# Patient Record
Sex: Female | Born: 1975
Health system: Southern US, Community
[De-identification: ages and names within clinical notes are randomized; demographics above are authoritative.]

## PROBLEM LIST (undated history)

## (undated) DIAGNOSIS — F419 Anxiety disorder, unspecified: Secondary | ICD-10-CM

## (undated) DIAGNOSIS — G43909 Migraine, unspecified, not intractable, without status migrainosus: Secondary | ICD-10-CM

## (undated) DIAGNOSIS — L409 Psoriasis, unspecified: Secondary | ICD-10-CM

## (undated) DIAGNOSIS — M8580 Other specified disorders of bone density and structure, unspecified site: Secondary | ICD-10-CM

## (undated) HISTORY — DX: Migraine, unspecified, not intractable, without status migrainosus: G43.909

## (undated) HISTORY — DX: Other specified disorders of bone density and structure, unspecified site: M85.80

## (undated) HISTORY — DX: Psoriasis, unspecified: L40.9

## (undated) HISTORY — DX: Anxiety disorder, unspecified: F41.9

---

## 2007-01-22 ENCOUNTER — Ambulatory Visit: Payer: Self-pay | Admitting: Orthopaedic Surgery

## 2009-04-05 ENCOUNTER — Ambulatory Visit: Payer: Self-pay

## 2009-04-08 ENCOUNTER — Inpatient Hospital Stay: Payer: Self-pay

## 2011-09-08 ENCOUNTER — Ambulatory Visit: Payer: Self-pay

## 2012-07-29 ENCOUNTER — Ambulatory Visit: Payer: Self-pay | Admitting: Sports Medicine

## 2012-09-27 ENCOUNTER — Ambulatory Visit (INDEPENDENT_AMBULATORY_CARE_PROVIDER_SITE_OTHER): Payer: 59 | Admitting: Sports Medicine

## 2012-09-27 ENCOUNTER — Encounter: Payer: Self-pay | Admitting: Sports Medicine

## 2012-09-27 VITALS — BP 116/70 | Ht 62.0 in | Wt 138.0 lb

## 2012-09-27 DIAGNOSIS — M79609 Pain in unspecified limb: Secondary | ICD-10-CM

## 2012-09-27 DIAGNOSIS — M79604 Pain in right leg: Secondary | ICD-10-CM

## 2012-09-27 DIAGNOSIS — M84369A Stress fracture, unspecified tibia and fibula, initial encounter for fracture: Secondary | ICD-10-CM

## 2012-09-27 HISTORY — DX: Stress fracture, unspecified tibia and fibula, initial encounter for fracture: M84.369A

## 2012-09-27 HISTORY — DX: Pain in right leg: M79.604

## 2012-09-27 NOTE — Assessment & Plan Note (Signed)
Patient was fitted for a : standard, cushioned, semi-rigid orthotic. The orthotic was heated and afterward the patient stood on the orthotic blank positioned on the orthotic stand. The patient was positioned in subtalar neutral position and 10 degrees of ankle dorsiflexion in a weight bearing stance. After completion of molding, a stable base was applied to the orthotic blank. The blank was ground to a stable position for weight bearing. Size: 8 red EVA Base: blue EVA Posting: none Additional orthotic padding: none  Time 35 mins  Running gait comfortable in orthotics/  Hop test negative  OK to restart running at 20 min workouts qod and increase 5 mins per week  Reck with Dr Zachery Dauer if not imporved in 6 wks
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
## 2012-09-27 NOTE — Assessment & Plan Note (Signed)
She needs hip abduction exercises and hip core exercise series  Given today

## 2015-07-07 ENCOUNTER — Emergency Department
Admission: EM | Admit: 2015-07-07 | Discharge: 2015-07-07 | Disposition: A | Discharge status: Home / Self Care | Payer: 59 | Attending: Emergency Medicine | Admitting: Emergency Medicine

## 2015-07-07 ENCOUNTER — Emergency Department: Payer: 59

## 2015-07-07 ENCOUNTER — Encounter: Payer: Self-pay | Admitting: Emergency Medicine

## 2015-07-07 DIAGNOSIS — Y92410 Unspecified street and highway as the place of occurrence of the external cause: Secondary | ICD-10-CM | POA: Insufficient documentation

## 2015-07-07 DIAGNOSIS — S7012XA Contusion of left thigh, initial encounter: Secondary | ICD-10-CM | POA: Diagnosis not present

## 2015-07-07 DIAGNOSIS — S20212A Contusion of left front wall of thorax, initial encounter: Secondary | ICD-10-CM | POA: Insufficient documentation

## 2015-07-07 DIAGNOSIS — S7002XA Contusion of left hip, initial encounter: Secondary | ICD-10-CM | POA: Insufficient documentation

## 2015-07-07 DIAGNOSIS — Y998 Other external cause status: Secondary | ICD-10-CM | POA: Insufficient documentation

## 2015-07-07 DIAGNOSIS — Y9389 Activity, other specified: Secondary | ICD-10-CM | POA: Diagnosis not present

## 2015-07-07 DIAGNOSIS — S299XXA Unspecified injury of thorax, initial encounter: Secondary | ICD-10-CM | POA: Diagnosis present

## 2015-07-07 MED ORDER — PREDNISONE 10 MG PO TABS: 10.0000 mg | ORAL_TABLET | ORAL | Status: DC

## 2015-07-07 MED ORDER — OXYCODONE-ACETAMINOPHEN 5-325 MG PO TABS: 1.0000 | ORAL_TABLET | Freq: Four times a day (QID) | ORAL | Status: DC | PRN
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
## 2015-07-07 NOTE — Discharge Instructions (Signed)
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
## 2015-09-03 ENCOUNTER — Encounter: Payer: Self-pay | Admitting: Dietician

## 2015-09-03 ENCOUNTER — Encounter: Payer: 59 | Attending: Obstetrics and Gynecology | Admitting: Dietician

## 2015-09-03 VITALS — Ht 62.0 in | Wt 144.6 lb

## 2015-09-03 DIAGNOSIS — R635 Abnormal weight gain: Secondary | ICD-10-CM | POA: Diagnosis not present
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
## 2015-09-03 NOTE — Patient Instructions (Signed)
   Gradually increase calorie intake by adding about 100calories a day for 3-5 weeks, then adding another 100 calories for the same period of time until you reach At least 1200; actual needs for training are likely higher.   Andrea Goodman will investigate additional resources to help with caloric intake and weight management while training.

## 2016-08-20 ENCOUNTER — Other Ambulatory Visit: Payer: Self-pay | Admitting: Obstetrics and Gynecology

## 2016-08-20 DIAGNOSIS — Z1231 Encounter for screening mammogram for malignant neoplasm of breast: Secondary | ICD-10-CM

## 2016-10-01 ENCOUNTER — Encounter: Payer: Self-pay | Admitting: Radiology

## 2016-10-01 ENCOUNTER — Ambulatory Visit
Admission: RE | Admit: 2016-10-01 | Discharge: 2016-10-01 | Disposition: A | Discharge status: Home / Self Care | Payer: 59 | Source: Ambulatory Visit | Attending: Obstetrics and Gynecology | Admitting: Obstetrics and Gynecology

## 2016-10-01 DIAGNOSIS — Z1231 Encounter for screening mammogram for malignant neoplasm of breast: Secondary | ICD-10-CM | POA: Diagnosis not present

## 2016-11-21 ENCOUNTER — Ambulatory Visit: Payer: No Typology Code available for payment source | Admitting: Physical Therapy

## 2016-11-25 ENCOUNTER — Encounter: Payer: 59 | Admitting: Physical Therapy

## 2016-12-01 ENCOUNTER — Encounter: Payer: 59 | Admitting: Physical Therapy

## 2016-12-08 ENCOUNTER — Encounter: Payer: 59 | Admitting: Physical Therapy

## 2016-12-15 ENCOUNTER — Encounter: Payer: 59 | Admitting: Physical Therapy

## 2016-12-22 ENCOUNTER — Encounter: Payer: 59 | Admitting: Physical Therapy

## 2017-01-05 ENCOUNTER — Encounter: Payer: 59 | Admitting: Physical Therapy

## 2017-01-19 ENCOUNTER — Encounter: Payer: 59 | Admitting: Physical Therapy

## 2017-02-19 DIAGNOSIS — M542 Cervicalgia: Secondary | ICD-10-CM | POA: Diagnosis not present

## 2017-09-15 DIAGNOSIS — H1032 Unspecified acute conjunctivitis, left eye: Secondary | ICD-10-CM | POA: Diagnosis not present

## 2017-12-17 ENCOUNTER — Other Ambulatory Visit: Payer: Self-pay | Admitting: Obstetrics and Gynecology

## 2017-12-17 DIAGNOSIS — Z1231 Encounter for screening mammogram for malignant neoplasm of breast: Secondary | ICD-10-CM

## 2017-12-17 DIAGNOSIS — Z01419 Encounter for gynecological examination (general) (routine) without abnormal findings: Secondary | ICD-10-CM | POA: Diagnosis not present

## 2018-01-04 ENCOUNTER — Ambulatory Visit
Admission: RE | Admit: 2018-01-04 | Discharge: 2018-01-04 | Disposition: A | Discharge status: Home / Self Care | Payer: 59 | Source: Ambulatory Visit | Attending: Obstetrics and Gynecology | Admitting: Obstetrics and Gynecology

## 2018-01-04 DIAGNOSIS — Z1231 Encounter for screening mammogram for malignant neoplasm of breast: Secondary | ICD-10-CM

## 2018-01-11 ENCOUNTER — Other Ambulatory Visit: Payer: Self-pay | Admitting: Obstetrics and Gynecology

## 2018-01-11 DIAGNOSIS — N6489 Other specified disorders of breast: Secondary | ICD-10-CM

## 2018-01-11 DIAGNOSIS — R928 Other abnormal and inconclusive findings on diagnostic imaging of breast: Secondary | ICD-10-CM

## 2018-01-12 ENCOUNTER — Ambulatory Visit
Admission: RE | Admit: 2018-01-12 | Discharge: 2018-01-12 | Disposition: A | Discharge status: Home / Self Care | Payer: 59 | Source: Ambulatory Visit | Attending: Obstetrics and Gynecology | Admitting: Obstetrics and Gynecology

## 2018-01-12 DIAGNOSIS — N6489 Other specified disorders of breast: Secondary | ICD-10-CM

## 2018-01-12 DIAGNOSIS — R928 Other abnormal and inconclusive findings on diagnostic imaging of breast: Secondary | ICD-10-CM | POA: Diagnosis not present

## 2018-09-27 ENCOUNTER — Other Ambulatory Visit: Payer: Self-pay | Admitting: Obstetrics and Gynecology

## 2018-09-27 MED ORDER — OSELTAMIVIR PHOSPHATE 75 MG PO CAPS: 75.0000 mg | ORAL_CAPSULE | Freq: Two times a day (BID) | ORAL | 0 refills | Status: AC

## 2018-09-27 NOTE — Progress Notes (Signed)
Rx tamiflu for pos flu sx. Dad with pos flu and test.

## 2019-02-08 DIAGNOSIS — Z1231 Encounter for screening mammogram for malignant neoplasm of breast: Secondary | ICD-10-CM | POA: Diagnosis not present

## 2019-02-08 DIAGNOSIS — Z01419 Encounter for gynecological examination (general) (routine) without abnormal findings: Secondary | ICD-10-CM | POA: Diagnosis not present

## 2019-02-09 ENCOUNTER — Other Ambulatory Visit: Payer: Self-pay | Admitting: Obstetrics and Gynecology

## 2019-02-09 DIAGNOSIS — Z1231 Encounter for screening mammogram for malignant neoplasm of breast: Secondary | ICD-10-CM

## 2019-06-26 IMAGING — MG MM DIGITAL SCREENING BILAT W/ TOMO W/ CAD
8 of 14 series · 8 of 30 positions shown · non-contrast
Comparison: Previous exam(s).

CLINICAL DATA: Screening.

EXAM:
DIGITAL SCREENING BILATERAL MAMMOGRAM WITH TOMO AND CAD

[R XCCL]
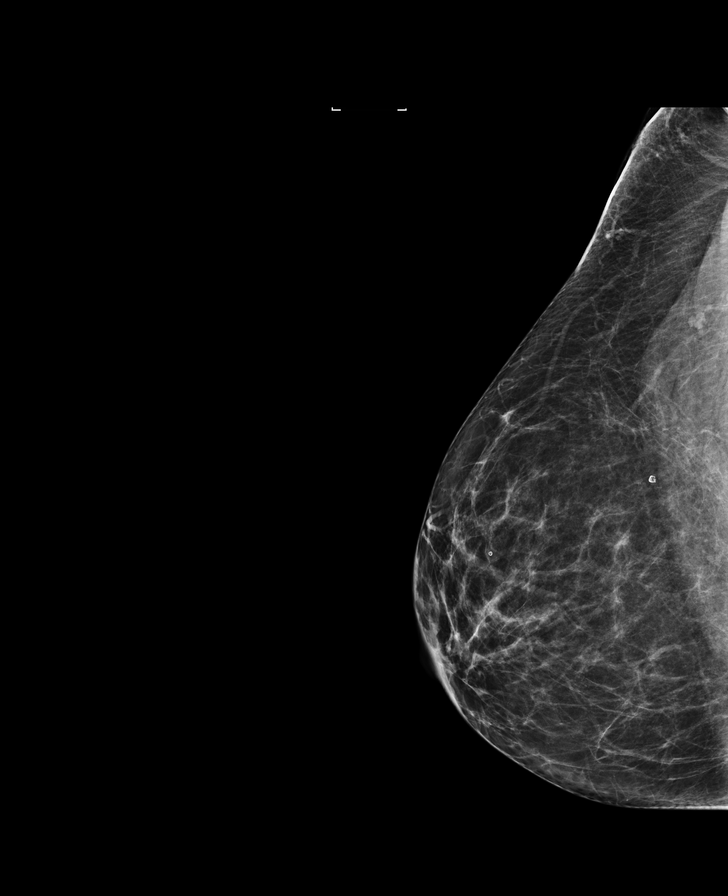

[L MLO (1 of 2)]
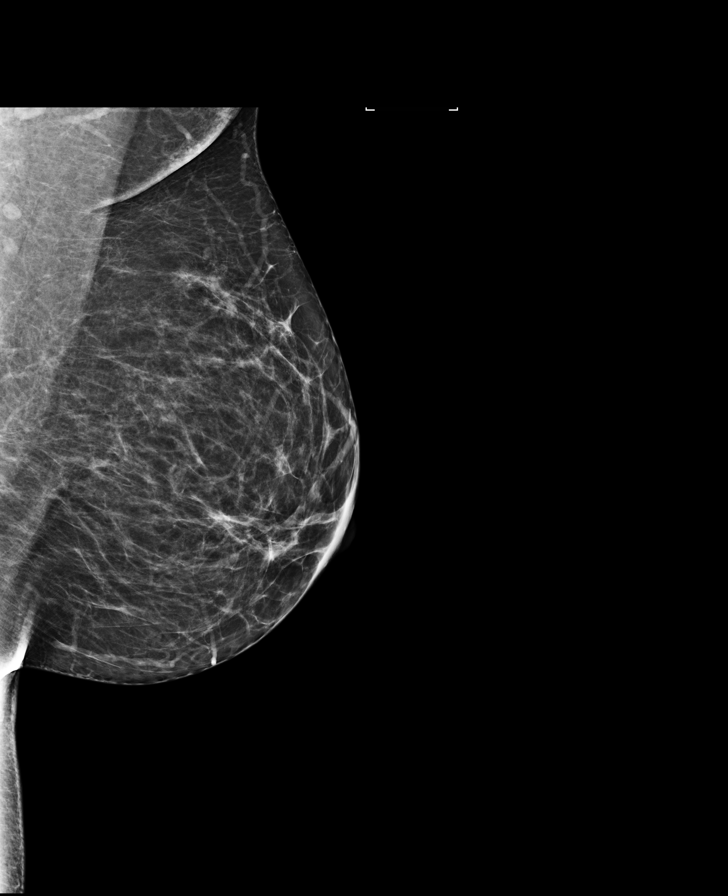

[L CC synth-2D]
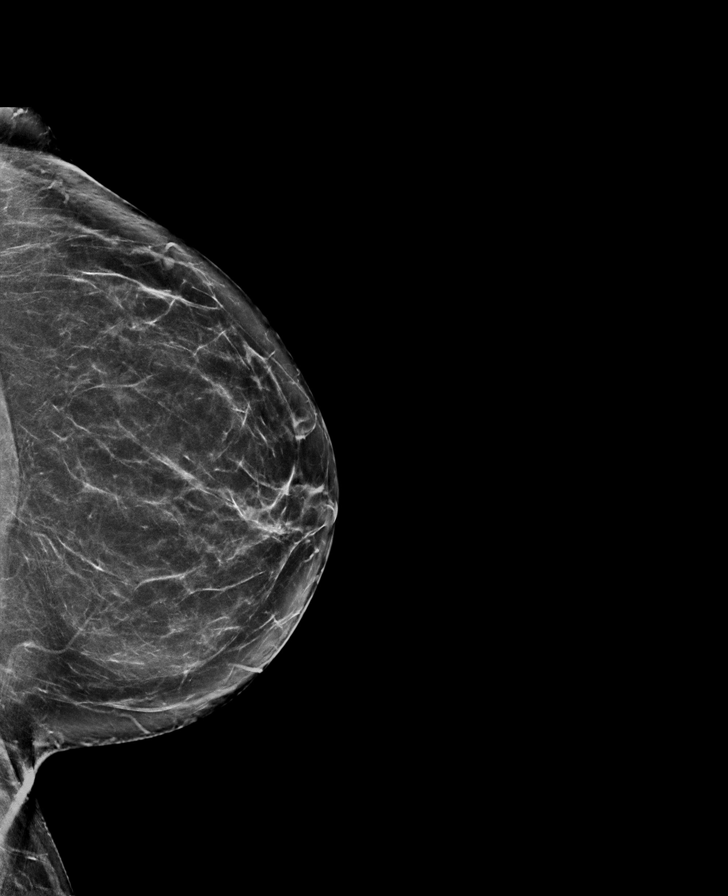

[L MLO synth-2D]
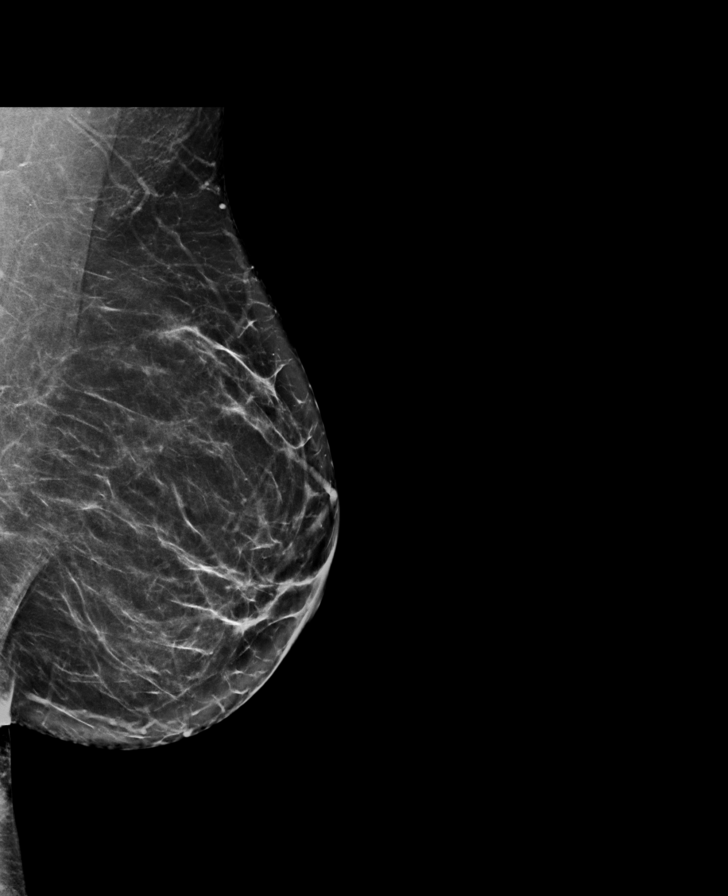

[L MLO (2 of 2)]
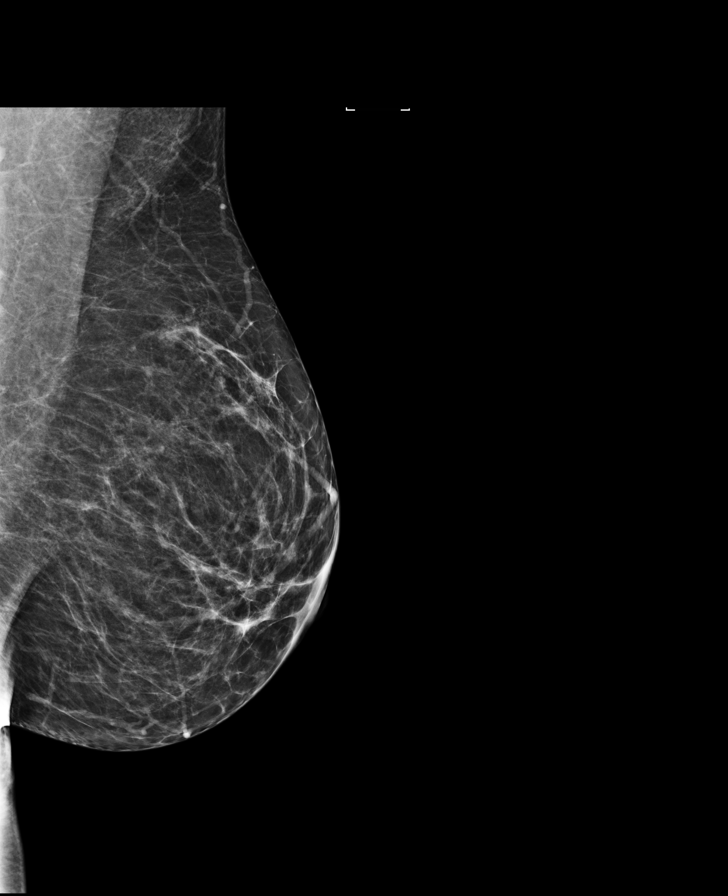

[R CC]
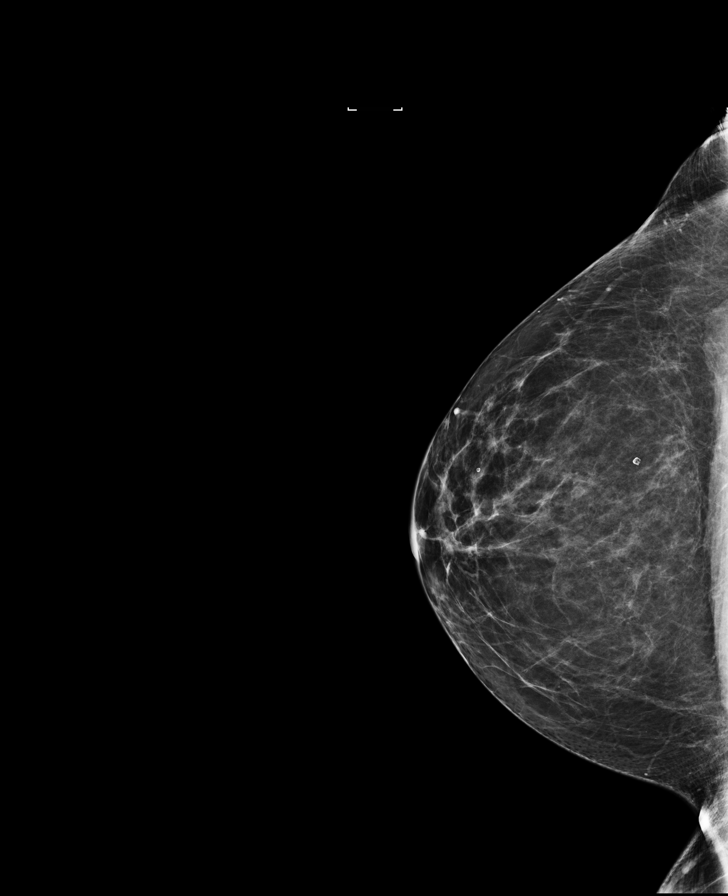

[R MLO synth-2D]
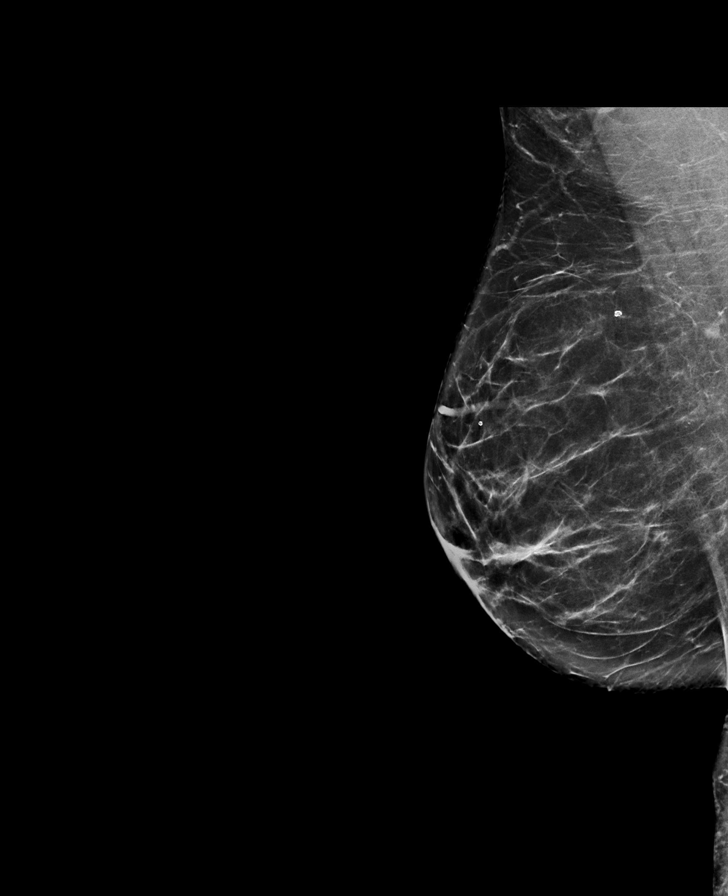

[L CC]
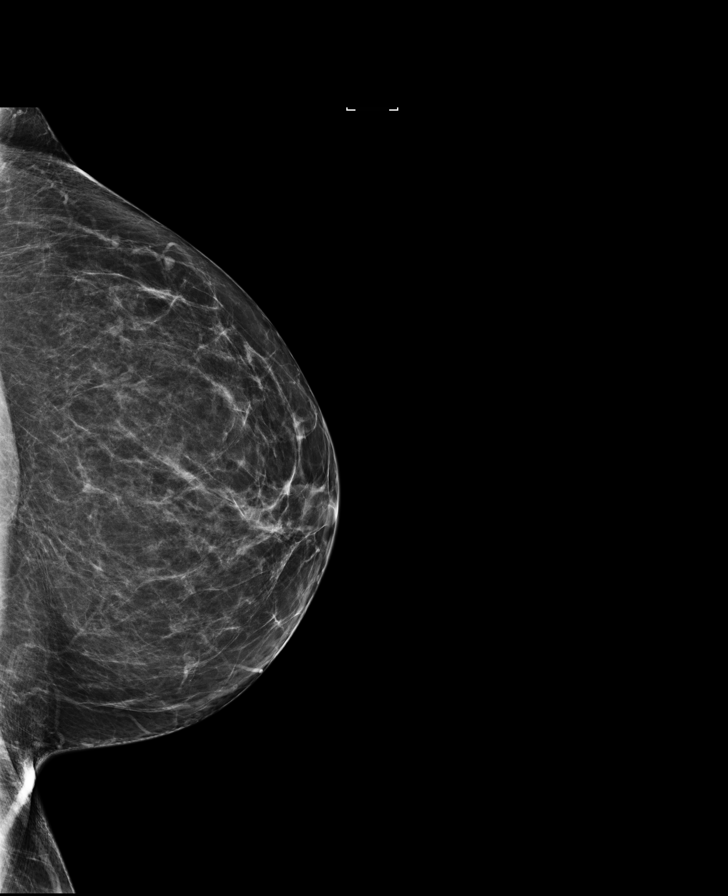

[8 of 30 positions shown; findings below may reference images not displayed]

ACR Breast Density Category b: There are scattered areas of
fibroglandular density.
FINDINGS: In the right breast, a possible asymmetry warrants further
evaluation. In the left breast, no findings suspicious for
malignancy. Images were processed with CAD.
IMPRESSION: Further evaluation is suggested for possible asymmetry in the right
breast.

RECOMMENDATION:
Diagnostic mammogram and possibly ultrasound of the right breast.
(Code:[id number]-[id number])

The patient will be contacted regarding the findings, and additional
imaging will be scheduled.

BI-RADS CATEGORY  0: Incomplete. Need additional imaging evaluation
and/or prior mammograms for comparison.

## 2019-07-26 ENCOUNTER — Other Ambulatory Visit: Payer: Self-pay | Admitting: Neurology

## 2019-07-26 DIAGNOSIS — G43119 Migraine with aura, intractable, without status migrainosus: Secondary | ICD-10-CM

## 2019-12-21 ENCOUNTER — Other Ambulatory Visit: Payer: Self-pay

## 2019-12-21 MED ORDER — SKYRIZI (150 MG DOSE) 75 MG/0.83ML ~~LOC~~ PSKT: 150.0000 mg | PREFILLED_SYRINGE | SUBCUTANEOUS | 6 refills | Status: DC

## 2019-12-21 MED ORDER — SKYRIZI (150 MG DOSE) 75 MG/0.83ML ~~LOC~~ PSKT: 150.0000 mg | PREFILLED_SYRINGE | SUBCUTANEOUS | 1 refills | Status: DC

## 2019-12-21 NOTE — Progress Notes (Signed)
Phone note for labs in Andrea Goodman.

## 2020-01-24 ENCOUNTER — Ambulatory Visit: Payer: 59 | Admitting: Dermatology

## 2020-01-24 ENCOUNTER — Other Ambulatory Visit: Payer: Self-pay

## 2020-01-24 DIAGNOSIS — L988 Other specified disorders of the skin and subcutaneous tissue: Secondary | ICD-10-CM

## 2020-01-24 DIAGNOSIS — L409 Psoriasis, unspecified: Secondary | ICD-10-CM

## 2020-01-24 NOTE — Progress Notes (Addendum)
   Follow-Up Visit   Subjective  Andrea Goodman is a 44 y.o. female who presents for the following: Psoriasis. Patient is currently using Luxiq foam, Dermotic Oil, and Elidel Cream for psoriasis in the scalp, ears, chest, and skin folds. She was approved for [employer] and it will be delivered to her house today. She has not had injection training, but she has a friend that is a PA that she will ask to give her injections. Patient also aware that she can come back to our office to receive injections and/or training.  The following portions of the chart were reviewed this encounter and updated as appropriate:     Review of Systems: No other skin or systemic complaints.  Objective  Well appearing patient in no apparent distress; mood and affect are within normal limits.  A focused examination was performed including face, scalp, ears, chest. Relevant physical exam findings are noted in the Assessment and Plan.  Objective  Forehead, frown complex: Rhytides with facial movement   Objective  Scalp, ears, chest, skin folds: Erythema on frontal scalp, ears, with mild scaling.  Assessment & Plan  Elastosis of skin Forehead, frown complex  Improved with Botox injections. Patient pleased, but would like to have a little more added to forehead at next visit.  Psoriasis Scalp, ears, chest, skin folds  Patient has been approved for [employer] and will be delivered to her house today. She will have friend (PA) do injections. Pt advised she can come to office for injections/training if needed. She will start loading dose Skyrizi 150mg /mL SQ on week 0 and 4, then SQ q 12 weeks for maintenance.  Continue Luxiq foam qd/bid prn flares. Continue Dermotic Oil qd/bid prn flares. Continue Elidel Cream qd/bid prn flares.  Return in about 6 months (around 07/25/2020) for psoriasis.   IJamesetta Orleans, CMA, am acting as scribe for Brendolyn Patty, MD .  Documentation: I have reviewed the above  documentation for accuracy and completeness, and I agree with the above.  Brendolyn Patty MD

## 2020-04-02 ENCOUNTER — Other Ambulatory Visit: Payer: Self-pay

## 2020-04-02 ENCOUNTER — Ambulatory Visit (INDEPENDENT_AMBULATORY_CARE_PROVIDER_SITE_OTHER): Payer: Self-pay | Admitting: Dermatology

## 2020-04-02 DIAGNOSIS — L988 Other specified disorders of the skin and subcutaneous tissue: Secondary | ICD-10-CM

## 2020-04-02 NOTE — Progress Notes (Signed)
   Follow-Up Visit   Subjective  Andrea Goodman is a 44 y.o. female who presents for the following: Botox injections (Patient would like a little more injected to forehead today. ).   The following portions of the chart were reviewed this encounter and updated as appropriate:      Review of Systems:  No other skin or systemic complaints except as noted in HPI or Assessment and Plan.  Objective  Well appearing patient in no apparent distress; mood and affect are within normal limits.  A focused examination was performed including face. Relevant physical exam findings are noted in the Assessment and Plan.  Objective  Forehead, Glabella: Facial elastosis forehead, glabella  Images           Assessment & Plan  Elastosis of skin Forehead, Glabella  Botox Injection - Forehead, Glabella Location: See attached image  Informed consent: Discussed risks (infection, pain, bleeding, bruising, swelling, allergic reaction, paralysis of nearby muscles, eyelid droop, double vision, neck weakness, difficulty breathing, headache, undesirable cosmetic result, and need for additional treatment) and benefits of the procedure, as well as the alternatives.  Informed consent was obtained.  Preparation: The area was cleansed with alcohol.  Procedure Details:  Botox was injected into the dermis with a 30-gauge needle. Pressure applied to any bleeding. Ice packs offered for swelling.  Lot Number:  [id number] Expiration:  06/2022  Total Units Injected:  30  Plan: Patient was instructed to remain upright for 4 hours. Patient was instructed to avoid massaging the face and avoid vigorous exercise for the rest of the day. Tylenol may be used for headache.  Allow 2 weeks before returning to clinic for additional dosing as needed. Patient will call for any problems.   Return in about 3 months (around 07/03/2020) for Botox.   IJamesetta Orleans, CMA, am acting as scribe for Brendolyn Patty, MD  .  Documentation: I have reviewed the above documentation for accuracy and completeness, and I agree with the above.  Brendolyn Patty MD

## 2020-04-26 ENCOUNTER — Other Ambulatory Visit: Payer: Self-pay | Admitting: Obstetrics and Gynecology

## 2020-04-26 DIAGNOSIS — Z1231 Encounter for screening mammogram for malignant neoplasm of breast: Secondary | ICD-10-CM

## 2020-05-15 ENCOUNTER — Other Ambulatory Visit: Payer: Self-pay

## 2020-05-15 ENCOUNTER — Ambulatory Visit
Admission: RE | Admit: 2020-05-15 | Discharge: 2020-05-15 | Disposition: A | Discharge status: Home / Self Care | Payer: 59 | Source: Ambulatory Visit | Attending: Obstetrics and Gynecology | Admitting: Obstetrics and Gynecology

## 2020-05-15 DIAGNOSIS — Z1231 Encounter for screening mammogram for malignant neoplasm of breast: Secondary | ICD-10-CM | POA: Diagnosis not present

## 2020-06-18 ENCOUNTER — Other Ambulatory Visit: Payer: Self-pay

## 2020-06-18 ENCOUNTER — Ambulatory Visit (INDEPENDENT_AMBULATORY_CARE_PROVIDER_SITE_OTHER): Payer: 59 | Admitting: Dermatology

## 2020-06-18 DIAGNOSIS — L988 Other specified disorders of the skin and subcutaneous tissue: Secondary | ICD-10-CM

## 2020-06-18 NOTE — Progress Notes (Signed)
   Follow-Up Visit   Subjective  Andrea Goodman is a 44 y.o. female who presents for the following: Facial Elastosis (Botox injections). glabella and forehead   The following portions of the chart were reviewed this encounter and updated as appropriate:      Review of Systems:  No other skin or systemic complaints except as noted in HPI or Assessment and Plan.  Objective  Well appearing patient in no apparent distress; mood and affect are within normal limits.  A focused examination was performed including face. Relevant physical exam findings are noted in the Assessment and Plan.  Objective  Frown complex, forehead: Rhytides and volume loss.   Images     Assessment & Plan  Elastosis of skin Frown complex, forehead  Photo diagram from 04/02/20 was documented incorrectly. Frown complex should have been 20 units total (was marked 15u). Patient was charged and injected correctly at that visit.  Botox Injection - Frown complex, forehead Location: See attached image  Informed consent: Discussed risks (infection, pain, bleeding, bruising, swelling, allergic reaction, paralysis of nearby muscles, eyelid droop, double vision, neck weakness, difficulty breathing, headache, undesirable cosmetic result, and need for additional treatment) and benefits of the procedure, as well as the alternatives.  Informed consent was obtained.  Preparation: The area was cleansed with alcohol.  Procedure Details:  Botox was injected into the dermis with a 30-gauge needle. Pressure applied to any bleeding. Ice packs offered for swelling.  Lot Number:  [id number] Expiration:  08/2022  Total Units Injected:  30  Plan: Patient was instructed to remain upright for 4 hours. Patient was instructed to avoid massaging the face and avoid vigorous exercise for the rest of the day. Tylenol may be used for headache.  Allow 2 weeks before returning to clinic for additional dosing as needed. Patient will call  for any problems.   Return in about 3 months (around 09/17/2020) for Botox injections.  IJamesetta Orleans, CMA, am acting as scribe for Brendolyn Patty, MD .  Documentation: I have reviewed the above documentation for accuracy and completeness, and I agree with the above.  Brendolyn Patty MD

## 2020-06-19 ENCOUNTER — Encounter: Payer: 59 | Admitting: Dermatology

## 2020-06-25 ENCOUNTER — Ambulatory Visit: Payer: 59 | Admitting: Dermatology

## 2020-07-09 ENCOUNTER — Ambulatory Visit (INDEPENDENT_AMBULATORY_CARE_PROVIDER_SITE_OTHER): Payer: 59 | Admitting: Dermatology

## 2020-07-09 ENCOUNTER — Other Ambulatory Visit: Payer: Self-pay

## 2020-07-09 DIAGNOSIS — L409 Psoriasis, unspecified: Secondary | ICD-10-CM | POA: Diagnosis not present

## 2020-07-09 DIAGNOSIS — L578 Other skin changes due to chronic exposure to nonionizing radiation: Secondary | ICD-10-CM

## 2020-07-09 DIAGNOSIS — D1800 Hemangioma unspecified site: Secondary | ICD-10-CM

## 2020-07-09 DIAGNOSIS — L821 Other seborrheic keratosis: Secondary | ICD-10-CM

## 2020-07-09 DIAGNOSIS — L820 Inflamed seborrheic keratosis: Secondary | ICD-10-CM | POA: Diagnosis not present

## 2020-07-09 DIAGNOSIS — Z79899 Other long term (current) drug therapy: Secondary | ICD-10-CM

## 2020-07-09 DIAGNOSIS — L650 Telogen effluvium: Secondary | ICD-10-CM

## 2020-07-09 DIAGNOSIS — L988 Other specified disorders of the skin and subcutaneous tissue: Secondary | ICD-10-CM

## 2020-07-09 DIAGNOSIS — D229 Melanocytic nevi, unspecified: Secondary | ICD-10-CM

## 2020-07-09 DIAGNOSIS — Z1283 Encounter for screening for malignant neoplasm of skin: Secondary | ICD-10-CM | POA: Diagnosis not present

## 2020-07-09 DIAGNOSIS — L814 Other melanin hyperpigmentation: Secondary | ICD-10-CM

## 2020-07-09 NOTE — Progress Notes (Signed)
Follow-Up Visit   Subjective  Andrea Goodman is a 44 y.o. female who presents for the following: Annual Exam (Total body skin exam), Psoriasis (scalp, ears, chest, skyrizi injections q 12 wks, not having to use any topicals), and growths (forehead, legs, abd, pt picks at). Psoriasis is much improved, and no side effects from injections, no infections.  Pt recently had botox 3 weeks ago, glabella and forehead.  She has also noticed increased hair loss over past several months.  No surgeries or illnesses prior to onset. She has been anemic in the past.  She has enough protein in diet.   The following portions of the chart were reviewed this encounter and updated as appropriate:      Review of Systems:  No other skin or systemic complaints except as noted in HPI or Assessment and Plan.  Objective  Well appearing patient in no apparent distress; mood and affect are within normal limits.  A full examination was performed including scalp, head, eyes, ears, nose, lips, neck, chest, axillae, abdomen, back, buttocks, bilateral upper extremities, bilateral lower extremities, hands, feet, fingers, toes, fingernails, and toenails. All findings within normal limits unless otherwise noted below.  Objective  R hip x 1, R inframammary x 1, upper forehead x 9, L abdomen x 1 (12): Erythematous keratotic or waxy stuck-on papule.   Head - Anterior (Face)  Images        Objective  Scalp: No areas of hairloss, pt's baseline is thick hair and she has noticed thinning   Assessment & Plan    Skin cancer screening performed today.  Lentigines - Scattered tan macules - Discussed due to sun exposure - Benign, observe - Call for any changes  Seborrheic Keratoses - Stuck-on, waxy, tan-brown papules and plaques  - Discussed benign etiology and prognosis. - Observe - Call for any changes  Actinic Damage - diffuse scaly erythematous macules with underlying dyspigmentation - Recommend  daily broad spectrum sunscreen SPF 30+ to sun-exposed areas, reapply every 2 hours as needed.  - Call for new or changing lesions.  Melanocytic Nevi - Tan-brown and/or pink-flesh-colored symmetric macules and papules - Benign appearing on exam today - Observation - Call clinic for new or changing moles - Recommend daily use of broad spectrum spf 30+ sunscreen to sun-exposed areas.   Seborrheic Keratoses - Stuck-on, waxy, tan-brown papules and plaques  - Discussed benign etiology and prognosis. - Observe - Call for any changes  Hemangiomas - Red papules - Discussed benign nature - Observe - Call for any changes  Long term medication management.  Psoriasis Scalp, ears, trunk  Controlled on [employer] sq injections q 12 wks No s/e of medication  Reviewed risks of biologics including immunosuppression, infections, injection site reaction, and failure to improve condition. Goal is control of skin condition, not cure.  Some older biologics such as Humira and Enbrel may slightly increase risk of malignancy and may worsen congestive heart failure. The use of biologics requires long term medication management, including periodic office visits and monitoring of blood work.   Inflamed seborrheic keratosis (12) R hip x 1, R inframammary x 1, upper forehead x 9, L abdomen x 1  Destruction of lesion - R hip x 1, R inframammary x 1, upper forehead x 9, L abdomen x 1  Destruction method: cryotherapy   Informed consent: discussed and consent obtained   Lesion destroyed using liquid nitrogen: Yes   Region frozen until ice ball extended beyond lesion: Yes  Outcome: patient tolerated procedure well with no complications   Post-procedure details: wound care instructions given    Elastosis of skin Head - Anterior (Face)  Pt pleased with recent Botox on 06/18/20 where dose was applied lower on the R forehead to treat wrinkle in same area  Discussed at next treatment will plan  to move forehead dosing on left forehead down a little to pull up the L brow since slightly asymmetric  Telogen effluvium Scalp  Vs Age related hormonal hair thinning vs other  Will check pts ferritin, CBC and Thyroid  Other Related Procedures CBC Ferritin Thyroid Panel With TSH  Return in about 6 months (around 01/07/2021) for Psoriasis and hair loss f/u.   I, Othelia Pulling, RMA, am acting as scribe for Brendolyn Patty, MD .  Documentation: I have reviewed the above documentation for accuracy and completeness, and I agree with the above.  Brendolyn Patty MD

## 2020-07-09 NOTE — Patient Instructions (Addendum)
Cryotherapy Aftercare  . Wash gently with soap and water everyday.   Marland Kitchen Apply Vaseline and Band-Aid daily until healed. . Protect from sun exposure while healing     Seborrheic Keratosis  What causes seborrheic keratoses? Seborrheic keratoses are harmless, common skin growths that first appear during adult life.  As time goes by, more growths appear.  Some people may develop a large number of them.  Seborrheic keratoses appear on both covered and uncovered body parts.  They are not caused by sunlight.  The tendency to develop seborrheic keratoses can be inherited.  They vary in color from skin-colored to gray, brown, or even black.  They can be either smooth or have a rough, warty surface.   Seborrheic keratoses are superficial and look as if they were stuck on the skin.  Under the microscope this type of keratosis looks like layers upon layers of skin.  That is why at times the top layer may seem to fall off, but the rest of the growth remains and re-grows.    Treatment Seborrheic keratoses do not need to be treated, but can easily be removed in the office.  Seborrheic keratoses often cause symptoms when they rub on clothing or jewelry.  Lesions can be in the way of shaving.  If they become inflamed, they can cause itching, soreness, or burning.  Removal of a seborrheic keratosis can be accomplished by freezing, burning, or surgery. If any spot bleeds, scabs, or grows rapidly, please return to have it checked, as these can be an indication of a skin cancer.

## 2020-07-10 ENCOUNTER — Telehealth: Payer: Self-pay

## 2020-07-10 LAB — THYROID PANEL WITH TSH
Free Thyroxine Index: 2 (ref 1.2–4.9)
T3 Uptake Ratio: 20 % — ABNORMAL LOW (ref 24–39)
T4, Total: 9.8 ug/dL (ref 4.5–12.0)
TSH: 1.05 u[IU]/mL (ref 0.450–4.500)

## 2020-07-10 LAB — CBC
Hematocrit: 43.1 % (ref 34.0–46.6)
Hemoglobin: 14.6 g/dL (ref 11.1–15.9)
MCH: 31.3 pg (ref 26.6–33.0)
MCHC: 33.9 g/dL (ref 31.5–35.7)
MCV: 93 fL (ref 79–97)
Platelets: 267 10*3/uL (ref 150–450)
RBC: 4.66 x10E6/uL (ref 3.77–5.28)
RDW: 12.7 % (ref 11.7–15.4)
WBC: 6.8 10*3/uL (ref 3.4–10.8)

## 2020-07-10 LAB — FERRITIN: Ferritin: 55 ng/mL (ref 15–150)

## 2020-07-10 NOTE — Telephone Encounter (Signed)
-----   Message from Brendolyn Patty, MD sent at 07/10/2020  8:24 AM EDT ----- Labs ok Hb, Ferritin, TSH all wnl Probably not cause of hair loss

## 2020-07-10 NOTE — Telephone Encounter (Signed)
Advised pt of lab results./sh 

## 2020-07-12 ENCOUNTER — Other Ambulatory Visit: Payer: Self-pay

## 2020-07-12 MED ORDER — SKYRIZI 150 MG/ML ~~LOC~~ SOSY: 150.0000 mg | PREFILLED_SYRINGE | SUBCUTANEOUS | 3 refills | Status: DC

## 2020-07-12 NOTE — Progress Notes (Signed)
New dosage for [employer]

## 2020-09-17 ENCOUNTER — Other Ambulatory Visit: Payer: Self-pay

## 2020-09-17 ENCOUNTER — Ambulatory Visit (INDEPENDENT_AMBULATORY_CARE_PROVIDER_SITE_OTHER): Payer: Self-pay | Admitting: Dermatology

## 2020-09-17 DIAGNOSIS — L988 Other specified disorders of the skin and subcutaneous tissue: Secondary | ICD-10-CM

## 2020-09-17 NOTE — Progress Notes (Signed)
   Follow-Up Visit   Subjective  Andrea Goodman is a 44 y.o. female who presents for the following: Facial Elastosis (Face, pts last Botox appt 06/18/20).   The following portions of the chart were reviewed this encounter and updated as appropriate:       Review of Systems:  No other skin or systemic complaints except as noted in HPI or Assessment and Plan.  Objective  Well appearing patient in no apparent distress; mood and affect are within normal limits.  A focused examination was performed including face. Relevant physical exam findings are noted in the Assessment and Plan.  Objective  face: Rhytides and volume loss.   Images     Assessment & Plan  Elastosis of skin face  Botox 30 units injected to: Frown complex 20 Forehead 10 units  Botox Injection - face Location: Frown complex, forehead  Informed consent: Discussed risks (infection, pain, bleeding, bruising, swelling, allergic reaction, paralysis of nearby muscles, eyelid droop, double vision, neck weakness, difficulty breathing, headache, undesirable cosmetic result, and need for additional treatment) and benefits of the procedure, as well as the alternatives.  Informed consent was obtained.  Preparation: The area was cleansed with alcohol.  Procedure Details:  Botox was injected into the dermis with a 30-gauge needle. Pressure applied to any bleeding. Ice packs offered for swelling.  Lot Number:  [id number] Expiration:  12/2022  Total Units Injected:  30  Plan: Patient was instructed to remain upright for 4 hours. Patient was instructed to avoid massaging the face and avoid vigorous exercise for the rest of the day. Tylenol may be used for headache.  Allow 2 weeks before returning to clinic for additional dosing as needed. Patient will call for any problems.   Return for 3-28m Botox.  I, Othelia Pulling, RMA, am acting as scribe for Brendolyn Patty, MD . Documentation: I have reviewed the above documentation  for accuracy and completeness, and I agree with the above.  Brendolyn Patty MD

## 2020-09-19 NOTE — Telephone Encounter (Signed)
Error. sh

## 2020-12-17 ENCOUNTER — Ambulatory Visit (INDEPENDENT_AMBULATORY_CARE_PROVIDER_SITE_OTHER): Payer: Self-pay | Admitting: Dermatology

## 2020-12-17 ENCOUNTER — Other Ambulatory Visit: Payer: Self-pay

## 2020-12-17 DIAGNOSIS — L988 Other specified disorders of the skin and subcutaneous tissue: Secondary | ICD-10-CM

## 2020-12-17 NOTE — Progress Notes (Signed)
   Follow-Up Visit   Subjective  Kenise Barraco is a 45 y.o. female who presents for the following: Facial Elastosis (Patient is here today for botox. She denies any new concerns at this time. ).  Patient would like same dosing as last appt.   The following portions of the chart were reviewed this encounter and updated as appropriate:        Objective  Well appearing patient in no apparent distress; mood and affect are within normal limits.  A focused examination was performed including face. Relevant physical exam findings are noted in the Assessment and Plan.  Objective  face: Rhytides and volume loss.   Images    Assessment & Plan  Elastosis of skin face  Botox Injection - face Location: See attached image  Informed consent: Discussed risks (infection, pain, bleeding, bruising, swelling, allergic reaction, paralysis of nearby muscles, eyelid droop, double vision, neck weakness, difficulty breathing, headache, undesirable cosmetic result, and need for additional treatment) and benefits of the procedure, as well as the alternatives.  Informed consent was obtained.  Preparation: The area was cleansed with alcohol.  Procedure Details:  Botox was injected into the dermis with a 30-gauge needle. Pressure applied to any bleeding. Ice packs offered for swelling.  Lot Number:  [id number] Expiration:  02/2023  Total Units Injected:  30  Plan: Patient was instructed to remain upright for 4 hours. Patient was instructed to avoid massaging the face and avoid vigorous exercise for the rest of the day. Tylenol may be used for headache.  Allow 2 weeks before returning to clinic for additional dosing as needed. Patient will call for any problems.   Return in about 3 months (around 03/19/2021) for botox, as scheduled for psoriasis.   Graciella Belton, RMA, am acting as scribe for Brendolyn Patty, MD .  Documentation: I have reviewed the above documentation for accuracy and  completeness, and I agree with the above.  Brendolyn Patty MD

## 2020-12-18 ENCOUNTER — Telehealth: Payer: Self-pay

## 2020-12-18 NOTE — Telephone Encounter (Signed)
Patient's PA for Orson Ape was denied for this new year. [employer] states office notes need to state patient in improving. Reduction of BSA from baseline and  improvement in symptoms.

## 2020-12-18 NOTE — Telephone Encounter (Signed)
She has a psoriasis f/up visit coming up soon and we can do that then.

## 2021-01-08 ENCOUNTER — Ambulatory Visit: Payer: 59 | Admitting: Dermatology

## 2021-01-08 ENCOUNTER — Other Ambulatory Visit: Payer: Self-pay

## 2021-01-08 DIAGNOSIS — L821 Other seborrheic keratosis: Secondary | ICD-10-CM | POA: Diagnosis not present

## 2021-01-08 DIAGNOSIS — L649 Androgenic alopecia, unspecified: Secondary | ICD-10-CM

## 2021-01-08 DIAGNOSIS — L409 Psoriasis, unspecified: Secondary | ICD-10-CM | POA: Diagnosis not present

## 2021-01-08 NOTE — Progress Notes (Signed)
Follow-Up Visit   Subjective  Andrea Goodman is a 45 y.o. female who presents for the following: 6 month follow up (Patient here today for follow up on psoriasis on scalp, ears and trunk. She reports she was using Skyrizi and had felt psoriasis was controlled. She has not been able to take [place] due to insurance and thinks some areas have returned. She is also here for follow up on telogen effluvium and states she is still experiencing hair loss and believes it is due to psoriasis.  ). It started about the same time she started [employer]. Patient denies family history, reports mom has thin hair, dad has thick hair, no illness, or hospitalization. Patient is on bc pills for many years and has not changed meds  Patient reports itching in scalp due to psoriasis   Patient is 2 and half weeks past due for skyrizi.  Prior to skyrizi psoriasis involved entire scalp and was very itchy despite treatment with multiple shampoo and topicals.  The following portions of the chart were reviewed this encounter and updated as appropriate:      Objective  Well appearing patient in no apparent distress; mood and affect are within normal limits.  A focused examination was performed including scalp, face, back, bilateral ears. Relevant physical exam findings are noted in the Assessment and Plan.  Objective  Scalp, ears, trunk: pink scaly patch post auricular bilateral  Erythema and scale at frontal hairline   Objective  Scalp: Thinning of frontal and temporal hairline, no scarring  Negative hair pull test  Pts ferritin, CBC and Thyroid were reviewed and normal  Assessment & Plan  Psoriasis Scalp, ears, trunk  Was well-controlled on Skyrizi, now starting to flare in scalp and ears off treatment.    Orson Ape is very effective and medically necessary to best manage her scalp psoriasis which is symptomatic (very itchy). Patient is 2 1/2 weeks past due for Skyrizi due to insurance coverage issue.   Patient would like to continue Skyrizi since it has helped her more than anything else she has tried  [employer] sq injections q 12 wks No s/e of medication Patient currently using Luxiq, Elidel, and Dermasmooth but these don't help clear the scalp psoriasis like the Orson Ape has.  Psoriasis is a chronic non-curable, but treatable genetic/hereditary disease that may have other systemic features affecting other organ systems such as joints (Psoriatic Arthritis). It is associated with an increased risk of inflammatory bowel disease, heart disease, non-alcoholic fatty liver disease, and depression.   Reviewed risks of biologics including immunosuppression, infections, injection site reaction, and failure to improve condition. Goal is control of skin condition, not cure.  Some older biologics such as Humira and Enbrel may slightly increase risk of malignancy and may worsen congestive heart failure. The use of biologics requires long term medication management, including periodic office visits and monitoring of blood work.   Androgenic alopecia Scalp  Possible Early age related hair loss, doubt side effect of Skyrizi.  Recommend talking with gyn about checking hormone levels to see if perimenopausal.  She takes a low estrogen OCP  Will check Vitamin D level, other labs nl (ferritin, CBC, Thyroid)   Discussed treatments to help with regrowth, including spironolactone, red light caps/helmets, topical or oral minoxidil  Recommend Rogaine 5% ffoam apply to affected areas of scalp qhs  Recommend minoxidil 5% (Rogaine for men) solution or foam to be applied to the scalp and left in. This should ideally be used twice daily for  best results but it helps with hair regrowth when used at least three times per week. Rogaine initially can cause increased hair shedding for the first few weeks but this will stop with continued use. In studies, people who used minoxidil (Rogaine) for at least 6 months had thicker  hair than people who did not. Minoxidil topical (Rogaine) only works as long as it continues to be used. If if it is no longer used then the hair it has been helping to regrow can fall out. Minoxidil topical (Rogaine) can cause increased facial hair growth which can usually be managed easily with a battery-operated hair trimmer. If facial hair growth is bothersome, switching to the 2% women's version can decrease the risk of unwanted facial hair growth.        Vitamin D, 25-hydroxy - Scalp  Seborrheic Keratoses - Stuck-on, waxy, tan-brown papules and/or plaques back - Benign-appearing - Discussed benign etiology and prognosis. - Observe - Call for any changes   Return in about 6 months (around 07/10/2021) for psoriasis and alopecia .  I, Ruthell Rummage, CMA, am acting as scribe for Brendolyn Patty, MD.  Documentation: I have reviewed the above documentation for accuracy and completeness, and I agree with the above.  Brendolyn Patty MD

## 2021-01-08 NOTE — Patient Instructions (Addendum)
   Psoriasis is a chronic non-curable, but treatable genetic/hereditary disease that may have other systemic features affecting other organ systems such as joints (Psoriatic Arthritis). It is associated with an increased risk of inflammatory bowel disease, heart disease, non-alcoholic fatty liver disease, and depression.    If you have any questions or concerns for your doctor, please call our main line at [phone] and press option 4 to reach your doctor's medical assistant. If no one answers, please leave a voicemail as directed and we will return your call as soon as possible. Messages left after 4 pm will be answered the following business day.   You may also send [place] a message via [place]. We typically respond to MyChart messages within 1-2 business days.  For prescription refills, please ask your pharmacy to contact our office. Our fax number is [phone].  If you have an urgent issue when the clinic is closed that cannot wait until the next business day, you can page your doctor at the number below.    Please note that while we do our best to be available for urgent issues outside of office hours, we are not available 24/7.   If you have an urgent issue and are unable to reach [place], you may choose to seek medical care at your doctor's office, retail clinic, urgent care center, or emergency room.  If you have a medical emergency, please immediately call 911 or go to the emergency department.  Pager Numbers  - Dr. Nehemiah Massed: [phone]  - Dr. Laurence Ferrari: [phone]  - Dr. Nicole Kindred: [phone]  In the event of inclement weather, please call our main line at [phone] for an update on the status of any delays or closures.  Dermatology Medication Tips: Please keep the boxes that topical medications come in in order to help keep track of the instructions about where and how to use these. Pharmacies typically print the medication instructions only on the boxes and not directly on the  medication tubes.   If your medication is too expensive, please contact our office at [phone] option 4 or send [place] a message through [place].   We are unable to tell what your co-pay for medications will be in advance as this is different depending on your insurance coverage. However, we may be able to find a substitute medication at lower cost or fill out paperwork to get insurance to cover a needed medication.   If a prior authorization is required to get your medication covered by your insurance company, please allow [place] 1-2 business days to complete this process.  Drug prices often vary depending on where the prescription is filled and some pharmacies may offer cheaper prices.  The website www.goodrx.com contains coupons for medications through different pharmacies. The prices here do not account for what the cost may be with help from insurance (it may be cheaper with your insurance), but the website can give you the price if you did not use any insurance.  - You can print the associated coupon and take it with your prescription to the pharmacy.  - You may also stop by our office during regular business hours and pick up a GoodRx coupon card.  - If you need your prescription sent electronically to a different pharmacy, notify our office through [facility] or by phone at [phone] option 4.

## 2021-03-18 ENCOUNTER — Other Ambulatory Visit: Payer: Self-pay

## 2021-03-18 ENCOUNTER — Ambulatory Visit (INDEPENDENT_AMBULATORY_CARE_PROVIDER_SITE_OTHER): Payer: Self-pay | Admitting: Dermatology

## 2021-03-18 DIAGNOSIS — L988 Other specified disorders of the skin and subcutaneous tissue: Secondary | ICD-10-CM

## 2021-03-18 NOTE — Patient Instructions (Signed)

## 2021-03-18 NOTE — Progress Notes (Signed)
   Follow-Up Visit   Subjective  Andrea Goodman is a 45 y.o. female who presents for the following: Facial Elastosis (Patient presents for Botox injections. Last treatment 12/17/2020. Patient pleased with last dosing. ).   The following portions of the chart were reviewed this encounter and updated as appropriate:        Review of Systems:  No other skin or systemic complaints except as noted in HPI or Assessment and Plan.  Objective  Well appearing patient in no apparent distress; mood and affect are within normal limits.  A focused examination was performed including face. Relevant physical exam findings are noted in the Assessment and Plan.  Glabella Rhytides and volume loss.       Assessment & Plan  Elastosis of skin Glabella  Botox injections as marked for the glabella and forehead. Total 30 units.  Botox Injection - Glabella Location: See attached image  Informed consent: Discussed risks (infection, pain, bleeding, bruising, swelling, allergic reaction, paralysis of nearby muscles, eyelid droop, double vision, neck weakness, difficulty breathing, headache, undesirable cosmetic result, and need for additional treatment) and benefits of the procedure, as well as the alternatives.  Informed consent was obtained.  Preparation: The area was cleansed with alcohol.  Procedure Details:  Botox was injected into the dermis with a 30-gauge needle. Pressure applied to any bleeding. Ice packs offered for swelling.  Lot Number:  [id number] Expiration:  02/2023  Total Units Injected:  30  Plan: Patient was instructed to remain upright for 4 hours. Patient was instructed to avoid massaging the face and avoid vigorous exercise for the rest of the day. Tylenol may be used for headache.  Allow 2 weeks before returning to clinic for additional dosing as needed. Patient will call for any problems.   Return in about 3 months (around 06/18/2021) for botox.  IJamesetta Orleans, CMA, am  acting as scribe for Brendolyn Patty, MD .  Documentation: I have reviewed the above documentation for accuracy and completeness, and I agree with the above.  Brendolyn Patty MD

## 2021-06-05 ENCOUNTER — Other Ambulatory Visit: Payer: Self-pay | Admitting: Dermatology

## 2021-06-24 ENCOUNTER — Other Ambulatory Visit: Payer: Self-pay

## 2021-06-24 ENCOUNTER — Ambulatory Visit (INDEPENDENT_AMBULATORY_CARE_PROVIDER_SITE_OTHER): Payer: Self-pay | Admitting: Dermatology

## 2021-06-24 DIAGNOSIS — L988 Other specified disorders of the skin and subcutaneous tissue: Secondary | ICD-10-CM

## 2021-06-24 NOTE — Progress Notes (Signed)
   Follow-Up Visit   Subjective  Andrea Goodman is a 45 y.o. female who presents for the following: Facial Elastosis (Patient here today for botox injections. ).  Patient pleased with current dosing.   The following portions of the chart were reviewed this encounter and updated as appropriate:       Review of Systems:  No other skin or systemic complaints except as noted in HPI or Assessment and Plan.  Objective  Well appearing patient in no apparent distress; mood and affect are within normal limits.  A focused examination was performed including face. Relevant physical exam findings are noted in the Assessment and Plan.  face Rhytides and volume loss.       Assessment & Plan  Elastosis of skin face  Filling material injection - face Location: See attached image  Informed consent: Discussed risks (infection, pain, bleeding, bruising, swelling, allergic reaction, paralysis of nearby muscles, eyelid droop, double vision, neck weakness, difficulty breathing, headache, undesirable cosmetic result, and need for additional treatment) and benefits of the procedure, as well as the alternatives.  Informed consent was obtained.  Preparation: The area was cleansed with alcohol.  Procedure Details:  Botox was injected into the dermis with a 30-gauge needle. Pressure applied to any bleeding. Ice packs offered for swelling.  Lot Number:  [id number] Expiration:  04/2023  Total Units Injected:  30  Plan: Tylenol may be used for headache.  Allow 2 weeks before returning to clinic for additional dosing as needed. Patient will call for any problems.   Return in about 3 months (around 09/23/2021) for Botox.  Graciella Belton, RMA, am acting as scribe for Brendolyn Patty, MD .   Documentation: I have reviewed the above documentation for accuracy and completeness, and I agree with the above.  Brendolyn Patty MD

## 2021-06-24 NOTE — Patient Instructions (Signed)

## 2021-07-29 ENCOUNTER — Ambulatory Visit: Payer: 59 | Admitting: Dermatology

## 2021-07-29 ENCOUNTER — Other Ambulatory Visit: Payer: Self-pay

## 2021-07-29 DIAGNOSIS — L409 Psoriasis, unspecified: Secondary | ICD-10-CM | POA: Diagnosis not present

## 2021-07-29 DIAGNOSIS — L988 Other specified disorders of the skin and subcutaneous tissue: Secondary | ICD-10-CM

## 2021-07-29 DIAGNOSIS — L650 Telogen effluvium: Secondary | ICD-10-CM | POA: Diagnosis not present

## 2021-07-29 NOTE — Patient Instructions (Signed)

## 2021-07-29 NOTE — Progress Notes (Signed)
Follow-Up Visit   Subjective  Andrea Goodman is a 45 y.o. female who presents for the following: Psoriasis (Scalp, ears, trunk, Skyrizi sq injections q 12 wks, Not having to use Luxiq, Elidel, or Dermasmooth FS oil, no joint issues) and Alopecia (Scalp, has improved, not using Rogain).  No side effects from [place].  No infections.  No h/o arthritis.   The following portions of the chart were reviewed this encounter and updated as appropriate:       Review of Systems:  No other skin or systemic complaints except as noted in HPI or Assessment and Plan.  Objective  Well appearing patient in no apparent distress; mood and affect are within normal limits.  A focused examination was performed including scalp, ears, trunk. Relevant physical exam findings are noted in the Assessment and Plan.  trunk, scalp, ears Mild erythema and scale L post auricular, mild erythema frontal hairline  Scalp Short hairs at frontal hairline c/w regrowth, minimal thinning rest of scalp  face Rhytides and volume loss.    Assessment & Plan  Psoriasis trunk, scalp, ears  Psoriasis - severe on systemic "biologic" treatment injections.  Psoriasis is a chronic non-curable, but treatable genetic/hereditary disease that may have other systemic features affecting other organ systems such as joints (Psoriatic Arthritis).  It is linked with heart disease, inflammatory bowel disease, non-alcoholic fatty liver disease, and depression. Significant skin psoriasis and/or psoriatic arthritis may have significant symptoms and affects activities of daily activity and often benefits from systemic "biologic" injection treatments.  These "biologic" treatments have some potential side effects including immunosuppression and require pre-treatment laboratory screening and periodic laboratory monitoring and periodic in person evaluation and monitoring by the attending dermatologist physician (long term medication management).    Yearly labs today Cont Skyrizi sq injections q 12 weeks Cont DermaSmooth FS oil qd prn flares to ears, scalp Cont Luxiq foam qd aa scalp prn flares Cont Elidel cr qd/bid to face/body prn flares  Topical steroids (such as triamcinolone, fluocinolone, fluocinonide, mometasone, clobetasol, halobetasol, betamethasone, hydrocortisone) can cause thinning and lightening of the skin if they are used for too long in the same area. Your physician has selected the right strength medicine for your problem and area affected on the body. Please use your medication only as directed by your physician to prevent side effects.     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 It does not progress to baldness, and the hair eventually grows back on its own. It can be triggered by recent illness, recent surgery, thyroid disease, low iron stores, vitamin D deficiency, fad diets or rapid weight loss, hormonal changes such as pregnancy or birth control pills, and some medication. Usually the hair loss starts 2-3 months after the illness or health change. Rarely, it can continue for longer than a year.   Will check Vitamin D since it wasn't checked last visit  Elastosis of skin face  Discussed adding Botox to botox commas at next treatment  Discussed fillers to perioral area, mid face  Return in about 6 months (around 01/27/2022) for Psoriasis f/u.  I, Othelia Pulling, RMA, am acting as scribe for Brendolyn Patty, MD .  Documentation: I have reviewed the above documentation for  accuracy and completeness, and I agree with the above.  Brendolyn Patty MD

## 2021-07-31 ENCOUNTER — Telehealth: Payer: Self-pay

## 2021-07-31 LAB — VITAMIN D 25 HYDROXY (VIT D DEFICIENCY, FRACTURES): Vit D, 25-Hydroxy: 33.1 ng/mL (ref 30.0–100.0)

## 2021-07-31 NOTE — Telephone Encounter (Signed)
Left message for patient to call for lab results. 

## 2021-08-06 ENCOUNTER — Telehealth: Payer: Self-pay

## 2021-08-06 NOTE — Telephone Encounter (Signed)
Left message for patient to call for lab results. 

## 2021-08-07 ENCOUNTER — Telehealth: Payer: Self-pay

## 2021-08-07 LAB — CBC WITH DIFFERENTIAL/PLATELET
Basophils Absolute: 0 10*3/uL (ref 0.0–0.2)
Basos: 1 %
EOS (ABSOLUTE): 0.1 10*3/uL (ref 0.0–0.4)
Eos: 2 %
Hematocrit: 41.4 % (ref 34.0–46.6)
Hemoglobin: 14 g/dL (ref 11.1–15.9)
Immature Grans (Abs): 0 10*3/uL (ref 0.0–0.1)
Immature Granulocytes: 1 %
Lymphocytes Absolute: 1.8 10*3/uL (ref 0.7–3.1)
Lymphs: 31 %
MCH: 31.2 pg (ref 26.6–33.0)
MCHC: 33.8 g/dL (ref 31.5–35.7)
MCV: 92 fL (ref 79–97)
Monocytes Absolute: 0.4 10*3/uL (ref 0.1–0.9)
Monocytes: 6 %
Neutrophils Absolute: 3.5 10*3/uL (ref 1.4–7.0)
Neutrophils: 59 %
Platelets: 290 10*3/uL (ref 150–450)
RBC: 4.49 x10E6/uL (ref 3.77–5.28)
RDW: 12.9 % (ref 11.7–15.4)
WBC: 5.9 10*3/uL (ref 3.4–10.8)

## 2021-08-07 LAB — COMPREHENSIVE METABOLIC PANEL
ALT: 19 IU/L (ref 0–32)
AST: 20 IU/L (ref 0–40)
Albumin/Globulin Ratio: 1.8 (ref 1.2–2.2)
Albumin: 4.4 g/dL (ref 3.8–4.8)
Alkaline Phosphatase: 54 IU/L (ref 44–121)
BUN/Creatinine Ratio: 14 (ref 9–23)
BUN: 14 mg/dL (ref 6–24)
Bilirubin Total: 0.5 mg/dL (ref 0.0–1.2)
CO2: 22 mmol/L (ref 20–29)
Calcium: 9.2 mg/dL (ref 8.7–10.2)
Chloride: 104 mmol/L (ref 96–106)
Creatinine, Ser: 1.03 mg/dL — ABNORMAL HIGH (ref 0.57–1.00)
Globulin, Total: 2.4 g/dL (ref 1.5–4.5)
Glucose: 84 mg/dL (ref 70–99)
Potassium: 4.4 mmol/L (ref 3.5–5.2)
Sodium: 139 mmol/L (ref 134–144)
Total Protein: 6.8 g/dL (ref 6.0–8.5)
eGFR: 68 mL/min/{1.73_m2} (ref >59)

## 2021-08-07 LAB — QUANTIFERON-TB GOLD PLUS
QuantiFERON Mitogen Value: >10 IU/mL
QuantiFERON Nil Value: 0.05 IU/mL
QuantiFERON TB1 Ag Value: 0.06 IU/mL
QuantiFERON TB2 Ag Value: 0.06 IU/mL
QuantiFERON-TB Gold Plus: NEGATIVE

## 2021-08-07 NOTE — Telephone Encounter (Signed)
-----   Message from Brendolyn Patty, MD sent at 08/07/2021  8:57 AM EDT ----- Labs okay, TB negative, send in 6 mo rfs Skyrizi - please call patient

## 2021-08-07 NOTE — Telephone Encounter (Signed)
Left pt msg to call for lab results/sh 

## 2021-08-13 ENCOUNTER — Telehealth: Payer: Self-pay

## 2021-08-13 MED ORDER — SKYRIZI 150 MG/ML ~~LOC~~ SOSY: 1.0000 | PREFILLED_SYRINGE | SUBCUTANEOUS | 3 refills | Status: DC

## 2021-08-13 NOTE — Telephone Encounter (Signed)
-----   Message from Brendolyn Patty, MD sent at 08/07/2021  8:57 AM EDT ----- Labs okay, TB negative, send in 6 mo rfs Skyrizi - please call patient

## 2021-08-13 NOTE — Telephone Encounter (Signed)
Advised pt of TB lab results and advised we would send in [place] refills.  Also advised pt of Vitamin D lab results and advised she could start otc Vitamin D 2000 IU qd./sh

## 2021-09-16 ENCOUNTER — Ambulatory Visit: Payer: 59 | Admitting: Dermatology

## 2021-11-18 ENCOUNTER — Other Ambulatory Visit: Payer: Self-pay

## 2021-11-18 ENCOUNTER — Ambulatory Visit (INDEPENDENT_AMBULATORY_CARE_PROVIDER_SITE_OTHER): Payer: Self-pay | Admitting: Dermatology

## 2021-11-18 ENCOUNTER — Ambulatory Visit: Payer: 59 | Admitting: Dermatology

## 2021-11-18 DIAGNOSIS — L988 Other specified disorders of the skin and subcutaneous tissue: Secondary | ICD-10-CM

## 2021-11-18 NOTE — Progress Notes (Signed)
° °  Follow-Up Visit   Subjective  Andrea Goodman is a 46 y.o. female who presents for the following: Facial Elastosis (Botox today to the frown complex and forehead. Patient would like to discuss adding Botox to Botox commas today. ).   The following portions of the chart were reviewed this encounter and updated as appropriate:       Review of Systems:  No other skin or systemic complaints except as noted in HPI or Assessment and Plan.  Objective  Well appearing patient in no apparent distress; mood and affect are within normal limits.  A focused examination was performed including face. Relevant physical exam findings are noted in the Assessment and Plan.  Frown complex, forehead Rhytides and volume loss.        Assessment & Plan  Elastosis of skin Frown complex, forehead    Botox Injection - Frown complex, forehead Location: See attached image  Informed consent: Discussed risks (infection, pain, bleeding, bruising, swelling, allergic reaction, paralysis of nearby muscles, eyelid droop, double vision, neck weakness, difficulty breathing, headache, undesirable cosmetic result, and need for additional treatment) and benefits of the procedure, as well as the alternatives.  Informed consent was obtained.  Preparation: The area was cleansed with alcohol.  Procedure Details:  Botox was injected into the dermis with a 30-gauge needle. Pressure applied to any bleeding. Ice packs offered for swelling.  Lot Number:  [id number] Expiration:  12/2023  Total Units Injected:  32.5  Plan: Patient was instructed to remain upright for 4 hours. Patient was instructed to avoid massaging the face and avoid vigorous exercise for the rest of the day. Tylenol may be used for headache.  Allow 2 weeks before returning to clinic for additional dosing as needed. Patient will call for any problems.    Return in about 3 months (around 02/15/2022) for Botox.  Graciella Belton, RMA, am acting  as scribe for Brendolyn Patty, MD .  Documentation: I have reviewed the above documentation for accuracy and completeness, and I agree with the above.  Brendolyn Patty MD

## 2021-11-18 NOTE — Patient Instructions (Signed)

## 2021-12-19 ENCOUNTER — Other Ambulatory Visit: Payer: Self-pay

## 2021-12-19 ENCOUNTER — Other Ambulatory Visit: Payer: Self-pay | Admitting: Obstetrics and Gynecology

## 2021-12-19 ENCOUNTER — Ambulatory Visit
Admission: RE | Admit: 2021-12-19 | Discharge: 2021-12-19 | Disposition: A | Discharge status: Home / Self Care | Payer: 59 | Source: Ambulatory Visit | Attending: Obstetrics and Gynecology | Admitting: Obstetrics and Gynecology

## 2021-12-19 DIAGNOSIS — Z1231 Encounter for screening mammogram for malignant neoplasm of breast: Secondary | ICD-10-CM | POA: Diagnosis present

## 2022-02-03 ENCOUNTER — Ambulatory Visit: Payer: 59 | Admitting: Dermatology

## 2022-02-17 ENCOUNTER — Telehealth: Payer: Self-pay

## 2022-02-17 ENCOUNTER — Ambulatory Visit: Payer: 59 | Admitting: Dermatology

## 2022-02-17 DIAGNOSIS — Z79899 Other long term (current) drug therapy: Secondary | ICD-10-CM

## 2022-02-17 DIAGNOSIS — L409 Psoriasis, unspecified: Secondary | ICD-10-CM

## 2022-02-17 DIAGNOSIS — L988 Other specified disorders of the skin and subcutaneous tissue: Secondary | ICD-10-CM

## 2022-02-17 NOTE — Telephone Encounter (Signed)
Left message for patient to return my call. She was in the office today for Botox injections, total 32.5 units. Patient was only charged for 30 units by accident.  ?

## 2022-02-17 NOTE — Progress Notes (Deleted)
? ?  Follow-Up Visit ?  ?Subjective  ?Andrea Goodman is a 46 y.o. female who presents for the following: Facial Elastosis and Psoriasis. ? ?*** ? ?The following portions of the chart were reviewed this encounter and updated as appropriate:  ?  ?  ? ?Review of Systems:  No other skin or systemic complaints except as noted in HPI or Assessment and Plan. ? ?Objective  ?Well appearing patient in no apparent distress; mood and affect are within normal limits. ? ?{Exam:[id number]::"A full examination was performed including scalp, head, eyes, ears, nose, lips, neck, chest, axillae, abdomen, back, buttocks, bilateral upper extremities, bilateral lower extremities, hands, feet, fingers, toes, fingernails, and toenails. All findings within normal limits unless otherwise noted below."} ? ? ? ?Assessment & Plan  ? ?No follow-ups on file. ? ? ?

## 2022-02-17 NOTE — Patient Instructions (Signed)

## 2022-02-17 NOTE — Progress Notes (Signed)
? ?Follow-Up Visit ?  ?Subjective  ?Andrea Goodman is a 46 y.o. female who presents for the following: Facial Elastosis (Pt here for  Botox today to the frown complex, forehead and Botox commas ) and Psoriasis (6 months f/u Psoriasis on the body treating with Skyrizi injections every 3 months with a good response.). No side effects.  Pt is very satisfied with treatment. ? ? ? ?The following portions of the chart were reviewed this encounter and updated as appropriate:  ?  ?  ? ?Review of Systems:  No other skin or systemic complaints except as noted in HPI or Assessment and Plan. ? ?Objective  ?Well appearing patient in no apparent distress; mood and affect are within normal limits. ? ?A focused examination was performed including face,arms,legs. Relevant physical exam findings are noted in the Assessment and Plan. ? ?frontal hairline, post auricular ?Mild erythema frontal hairline and post auricular, minimal scale   ? ?Frown complex, forehead ?Rhytides and volume loss.  ? ? ? ? ? ? ?Assessment & Plan  ?Psoriasis ?frontal hairline, post auricular ? ?Chronic condition with duration or expected duration over one year. Currently well-controlled on Skyrizi. ? ?Psoriasis - severe on systemic ?biologic? treatment injections.  Psoriasis is a chronic non-curable, but treatable genetic/hereditary disease that may have other systemic features affecting other organ systems such as joints (Psoriatic Arthritis).  It is linked with heart disease, inflammatory bowel disease, non-alcoholic fatty liver disease, and depression. Significant skin psoriasis and/or psoriatic arthritis may have significant symptoms and affects activities of daily activity and often benefits from systemic ?biologic? injection treatments.  These ?biologic? treatments have some potential side effects including immunosuppression and require pre-treatment laboratory screening and periodic laboratory monitoring and periodic in person evaluation and  monitoring by the attending dermatologist physician (long term medication management).  ?  ?Yearly labs at next office visit in 6 months  ?Cont Skyrizi sq injections q 12 weeks ?Cont DermaSmooth FS oil qd prn flares to ears, scalp ?Cont Luxiq foam qd aa scalp prn flares ?Cont Elidel cr qd/bid to face/body prn flares ?  ?Topical steroids (such as triamcinolone, fluocinolone, fluocinonide, mometasone, clobetasol, halobetasol, betamethasone, hydrocortisone) can cause thinning and lightening of the skin if they are used for too long in the same area. Your physician has selected the right strength medicine for your problem and area affected on the body. Please use your medication only as directed by your physician to prevent side effects.   ?  ? ?Elastosis of skin ?Frown complex, forehead ? ?Botox 32.5 units injected  ? ?Intralesional injection - Frown complex, forehead ?Location: See attached image ? ?Informed consent: Discussed risks (infection, pain, bleeding, bruising, swelling, allergic reaction, paralysis of nearby muscles, eyelid droop, double vision, neck weakness, difficulty breathing, headache, undesirable cosmetic result, and need for additional treatment) and benefits of the procedure, as well as the alternatives.  Informed consent was obtained. ? ?Preparation: The area was cleansed with alcohol. ? ?Procedure Details:  Botox was injected into the dermis with a 30-gauge needle. Pressure applied to any bleeding. Ice packs offered for swelling. ? ?Lot Number:  [id number] ?Expiration:  01/05/2024 ? ?Total Units Injected:  32.5 ? ?Plan: Tylenol may be used for headache.  Allow 2 weeks before returning to clinic for additional dosing as needed. Patient will call for any problems. ? ? ? ?Return in about 3 months (around 05/20/2022) for Botox and 6 months Psoriasis . ? ?I, Marye Round, CMA, am acting as scribe for Brendolyn Patty, MD .  ? ?  Documentation: I have reviewed the above documentation for accuracy and  completeness, and I agree with the above. ? ?Brendolyn Patty MD  ?

## 2022-06-02 ENCOUNTER — Ambulatory Visit (INDEPENDENT_AMBULATORY_CARE_PROVIDER_SITE_OTHER): Payer: Self-pay | Admitting: Dermatology

## 2022-06-02 DIAGNOSIS — L988 Other specified disorders of the skin and subcutaneous tissue: Secondary | ICD-10-CM

## 2022-06-02 NOTE — Patient Instructions (Signed)
Due to recent changes in healthcare laws, you may see results of your pathology and/or laboratory studies on MyChart before the doctors have had a chance to review them. We understand that in some cases there may be results that are confusing or concerning to you. Please understand that not all results are received at the same time and often the doctors may need to interpret multiple results in order to provide you with the best plan of care or course of treatment. Therefore, we ask that you please give us 2 business days to thoroughly review all your results before contacting the office for clarification. Should we see a critical lab result, you will be contacted sooner.   If You Need Anything After Your Visit  If you have any questions or concerns for your doctor, please call our main line at [phone] and press option 4 to reach your doctor's medical assistant. If no one answers, please leave a voicemail as directed and we will return your call as soon as possible. Messages left after 4 pm will be answered the following business day.   You may also send us a message via MyChart. We typically respond to MyChart messages within 1-2 business days.  For prescription refills, please ask your pharmacy to contact our office. Our fax number is [phone].  If you have an urgent issue when the clinic is closed that cannot wait until the next business day, you can page your doctor at the number below.    Please note that while we do our best to be available for urgent issues outside of office hours, we are not available 24/7.   If you have an urgent issue and are unable to reach us, you may choose to seek medical care at your doctor's office, retail clinic, urgent care center, or emergency room.  If you have a medical emergency, please immediately call 911 or go to the emergency department.  Pager Numbers  - Dr. Kowalski: [phone]  - Dr. Moye: [phone]  - Dr. Stewart:  [phone]  In the event of inclement weather, please call our main line at [phone] for an update on the status of any delays or closures.  Dermatology Medication Tips: Please keep the boxes that topical medications come in in order to help keep track of the instructions about where and how to use these. Pharmacies typically print the medication instructions only on the boxes and not directly on the medication tubes.   If your medication is too expensive, please contact our office at [phone] option 4 or send us a message through MyChart.   We are unable to tell what your co-pay for medications will be in advance as this is different depending on your insurance coverage. However, we may be able to find a substitute medication at lower cost or fill out paperwork to get insurance to cover a needed medication.   If a prior authorization is required to get your medication covered by your insurance company, please allow us 1-2 business days to complete this process.  Drug prices often vary depending on where the prescription is filled and some pharmacies may offer cheaper prices.  The website www.goodrx.com contains coupons for medications through different pharmacies. The prices here do not account for what the cost may be with help from insurance (it may be cheaper with your insurance), but the website can give you the price if you did not use any insurance.  - You can print the associated coupon and take it with   your prescription to the pharmacy.  - You may also stop by our office during regular business hours and pick up a GoodRx coupon card.  - If you need your prescription sent electronically to a different pharmacy, notify our office through [facility] MyChart or by phone at [phone] option 4.     Si Usted Necesita Algo Despus de Su Visita  Tambin puede enviarnos un mensaje a travs de MyChart. Por lo general respondemos a los mensajes de MyChart en el transcurso de 1 a 2  das hbiles.  Para renovar recetas, por favor pida a su farmacia que se ponga en contacto con nuestra oficina. Nuestro nmero de fax es el [phone].  Si tiene un asunto urgente cuando la clnica est cerrada y que no puede esperar hasta el siguiente da hbil, puede llamar/localizar a su doctor(a) al nmero que aparece a continuacin.   Por favor, tenga en cuenta que aunque hacemos todo lo posible para estar disponibles para asuntos urgentes fuera del horario de oficina, no estamos disponibles las 24 horas del da, los 7 das de la semana.   Si tiene un problema urgente y no puede comunicarse con nosotros, puede optar por buscar atencin mdica  en el consultorio de su doctor(a), en una clnica privada, en un centro de atencin urgente o en una sala de emergencias.  Si tiene una emergencia mdica, por favor llame inmediatamente al 911 o vaya a la sala de emergencias.  Nmeros de bper  - Dr. Kowalski: [phone]  - Dra. Moye: [phone]  - Dra. Stewart: [phone]  En caso de inclemencias del tiempo, por favor llame a nuestra lnea principal al [phone] para una actualizacin sobre el estado de cualquier retraso o cierre.  Consejos para la medicacin en dermatologa: Por favor, guarde las cajas en las que vienen los medicamentos de uso tpico para ayudarle a seguir las instrucciones sobre dnde y cmo usarlos. Las farmacias generalmente imprimen las instrucciones del medicamento slo en las cajas y no directamente en los tubos del medicamento.   Si su medicamento es muy caro, por favor, pngase en contacto con nuestra oficina llamando al [phone] y presione la opcin 4 o envenos un mensaje a travs de MyChart.   No podemos decirle cul ser su copago por los medicamentos por adelantado ya que esto es diferente dependiendo de la cobertura de su seguro. Sin embargo, es posible que podamos encontrar un medicamento sustituto a menor costo o llenar un formulario para que el  seguro cubra el medicamento que se considera necesario.   Si se requiere una autorizacin previa para que su compaa de seguros cubra su medicamento, por favor permtanos de 1 a 2 das hbiles para completar este proceso.  Los precios de los medicamentos varan con frecuencia dependiendo del lugar de dnde se surte la receta y alguna farmacias pueden ofrecer precios ms baratos.  El sitio web www.goodrx.com tiene cupones para medicamentos de diferentes farmacias. Los precios aqu no tienen en cuenta lo que podra costar con la ayuda del seguro (puede ser ms barato con su seguro), pero el sitio web puede darle el precio si no utiliz ningn seguro.  - Puede imprimir el cupn correspondiente y llevarlo con su receta a la farmacia.  - Tambin puede pasar por nuestra oficina durante el horario de atencin regular y recoger una tarjeta de cupones de GoodRx.  - Si necesita que su receta se enve electrnicamente a una farmacia diferente, informe a nuestra oficina a travs de MyChart de [facility]   o por telfono llamando al [phone] y presione la opcin 4.  

## 2022-06-02 NOTE — Progress Notes (Signed)
   Follow-Up Visit   Subjective  Andrea Goodman is a 46 y.o. female who presents for the following: Facial Elastosis (Face, pt presents for botox).   The following portions of the chart were reviewed this encounter and updated as appropriate:       Review of Systems:  No other skin or systemic complaints except as noted in HPI or Assessment and Plan.  Objective  Well appearing patient in no apparent distress; mood and affect are within normal limits.  A focused examination was performed including face. Relevant physical exam findings are noted in the Assessment and Plan.  face Rhytides and volume loss.        Assessment & Plan  Elastosis of skin face  Botox 32.5 units injected today to: - Frown complex 20 units - Forehead 10 units - Botox comma 1.25 x 2  Botox Injection - face Location: frown complex, forehead, botox comma bil  Informed consent: Discussed risks (infection, pain, bleeding, bruising, swelling, allergic reaction, paralysis of nearby muscles, eyelid droop, double vision, neck weakness, difficulty breathing, headache, undesirable cosmetic result, and need for additional treatment) and benefits of the procedure, as well as the alternatives.  Informed consent was obtained.  Preparation: The area was cleansed with alcohol.  Procedure Details:  Botox was injected into the dermis with a 30-gauge needle. Pressure applied to any bleeding. Ice packs offered for swelling.  Lot Number:  [id number] Expiration:  08/2024  Total Units Injected:  32.5 units  Plan: Patient was instructed to remain upright for 4 hours. Patient was instructed to avoid massaging the face and avoid vigorous exercise for the rest of the day. Tylenol may be used for headache.  Allow 2 weeks before returning to clinic for additional dosing as needed. Patient will call for any problems.    Return for 3-28mBotox.  I, SOthelia Pulling RMA, am acting as scribe for TBrendolyn Patty MD  .  Documentation: I have reviewed the above documentation for accuracy and completeness, and I agree with the above.  TBrendolyn PattyMD

## 2022-06-30 ENCOUNTER — Other Ambulatory Visit: Payer: Self-pay | Admitting: Dermatology

## 2022-08-04 LAB — HM COLONOSCOPY

## 2022-08-06 LAB — SURGICAL PATHOLOGY

## 2022-08-25 ENCOUNTER — Ambulatory Visit: Payer: 59 | Admitting: Dermatology

## 2022-08-25 DIAGNOSIS — L409 Psoriasis, unspecified: Secondary | ICD-10-CM

## 2022-08-25 DIAGNOSIS — Z79899 Other long term (current) drug therapy: Secondary | ICD-10-CM | POA: Diagnosis not present

## 2022-08-25 DIAGNOSIS — L988 Other specified disorders of the skin and subcutaneous tissue: Secondary | ICD-10-CM | POA: Diagnosis not present

## 2022-08-25 NOTE — Patient Instructions (Signed)
Due to recent changes in healthcare laws, you may see results of your pathology and/or laboratory studies on MyChart before the doctors have had a chance to review them. We understand that in some cases there may be results that are confusing or concerning to you. Please understand that not all results are received at the same time and often the doctors may need to interpret multiple results in order to provide you with the best plan of care or course of treatment. Therefore, we ask that you please give us 2 business days to thoroughly review all your results before contacting the office for clarification. Should we see a critical lab result, you will be contacted sooner.   If You Need Anything After Your Visit  If you have any questions or concerns for your doctor, please call our main line at [phone] and press option 4 to reach your doctor's medical assistant. If no one answers, please leave a voicemail as directed and we will return your call as soon as possible. Messages left after 4 pm will be answered the following business day.   You may also send us a message via MyChart. We typically respond to MyChart messages within 1-2 business days.  For prescription refills, please ask your pharmacy to contact our office. Our fax number is [phone].  If you have an urgent issue when the clinic is closed that cannot wait until the next business day, you can page your doctor at the number below.    Please note that while we do our best to be available for urgent issues outside of office hours, we are not available 24/7.   If you have an urgent issue and are unable to reach us, you may choose to seek medical care at your doctor's office, retail clinic, urgent care center, or emergency room.  If you have a medical emergency, please immediately call 911 or go to the emergency department.  Pager Numbers  - Dr. Kowalski: [phone]  - Dr. Moye: [phone]  - Dr. Stewart:  [phone]  In the event of inclement weather, please call our main line at [phone] for an update on the status of any delays or closures.  Dermatology Medication Tips: Please keep the boxes that topical medications come in in order to help keep track of the instructions about where and how to use these. Pharmacies typically print the medication instructions only on the boxes and not directly on the medication tubes.   If your medication is too expensive, please contact our office at [phone] option 4 or send us a message through MyChart.   We are unable to tell what your co-pay for medications will be in advance as this is different depending on your insurance coverage. However, we may be able to find a substitute medication at lower cost or fill out paperwork to get insurance to cover a needed medication.   If a prior authorization is required to get your medication covered by your insurance company, please allow us 1-2 business days to complete this process.  Drug prices often vary depending on where the prescription is filled and some pharmacies may offer cheaper prices.  The website www.goodrx.com contains coupons for medications through different pharmacies. The prices here do not account for what the cost may be with help from insurance (it may be cheaper with your insurance), but the website can give you the price if you did not use any insurance.  - You can print the associated coupon and take it with   your prescription to the pharmacy.  - You may also stop by our office during regular business hours and pick up a GoodRx coupon card.  - If you need your prescription sent electronically to a different pharmacy, notify our office through [facility] MyChart or by phone at [phone] option 4.     Si Usted Necesita Algo Despus de Su Visita  Tambin puede enviarnos un mensaje a travs de MyChart. Por lo general respondemos a los mensajes de MyChart en el transcurso de 1 a 2  das hbiles.  Para renovar recetas, por favor pida a su farmacia que se ponga en contacto con nuestra oficina. Nuestro nmero de fax es el [phone].  Si tiene un asunto urgente cuando la clnica est cerrada y que no puede esperar hasta el siguiente da hbil, puede llamar/localizar a su doctor(a) al nmero que aparece a continuacin.   Por favor, tenga en cuenta que aunque hacemos todo lo posible para estar disponibles para asuntos urgentes fuera del horario de oficina, no estamos disponibles las 24 horas del da, los 7 das de la semana.   Si tiene un problema urgente y no puede comunicarse con nosotros, puede optar por buscar atencin mdica  en el consultorio de su doctor(a), en una clnica privada, en un centro de atencin urgente o en una sala de emergencias.  Si tiene una emergencia mdica, por favor llame inmediatamente al 911 o vaya a la sala de emergencias.  Nmeros de bper  - Dr. Kowalski: [phone]  - Dra. Moye: [phone]  - Dra. Stewart: [phone]  En caso de inclemencias del tiempo, por favor llame a nuestra lnea principal al [phone] para una actualizacin sobre el estado de cualquier retraso o cierre.  Consejos para la medicacin en dermatologa: Por favor, guarde las cajas en las que vienen los medicamentos de uso tpico para ayudarle a seguir las instrucciones sobre dnde y cmo usarlos. Las farmacias generalmente imprimen las instrucciones del medicamento slo en las cajas y no directamente en los tubos del medicamento.   Si su medicamento es muy caro, por favor, pngase en contacto con nuestra oficina llamando al [phone] y presione la opcin 4 o envenos un mensaje a travs de MyChart.   No podemos decirle cul ser su copago por los medicamentos por adelantado ya que esto es diferente dependiendo de la cobertura de su seguro. Sin embargo, es posible que podamos encontrar un medicamento sustituto a menor costo o llenar un formulario para que el  seguro cubra el medicamento que se considera necesario.   Si se requiere una autorizacin previa para que su compaa de seguros cubra su medicamento, por favor permtanos de 1 a 2 das hbiles para completar este proceso.  Los precios de los medicamentos varan con frecuencia dependiendo del lugar de dnde se surte la receta y alguna farmacias pueden ofrecer precios ms baratos.  El sitio web www.goodrx.com tiene cupones para medicamentos de diferentes farmacias. Los precios aqu no tienen en cuenta lo que podra costar con la ayuda del seguro (puede ser ms barato con su seguro), pero el sitio web puede darle el precio si no utiliz ningn seguro.  - Puede imprimir el cupn correspondiente y llevarlo con su receta a la farmacia.  - Tambin puede pasar por nuestra oficina durante el horario de atencin regular y recoger una tarjeta de cupones de GoodRx.  - Si necesita que su receta se enve electrnicamente a una farmacia diferente, informe a nuestra oficina a travs de MyChart de [facility]   o por telfono llamando al [phone] y presione la opcin 4.  

## 2022-08-25 NOTE — Progress Notes (Signed)
Follow-Up Visit   Subjective  Andrea Goodman is a 46 y.o. female who presents for the following: Psoriasis (5 months f/u on Psoriasis treating with Skyrizi injection every 3 months with a good response, no side effects ) and Facial Elastosis (Patient presents for Botox ).   The following portions of the chart were reviewed this encounter and updated as appropriate:       Review of Systems:  No other skin or systemic complaints except as noted in HPI or Assessment and Plan.  Objective  Well appearing patient in no apparent distress; mood and affect are within normal limits.  A focused examination was performed including face. Relevant physical exam findings are noted in the Assessment and Plan.  frontal hairline, post auricular Mild erythema frontal hairline and post auricular, minimal scale     face Rhytides and volume loss.        Assessment & Plan  Psoriasis frontal hairline, post auricular  Chronic condition with duration or expected duration over one year. Currently well-controlled on Skyrizi.   Psoriasis - severe on systemic "biologic" treatment injections.  Psoriasis is a chronic non-curable, but treatable genetic/hereditary disease that may have other systemic features affecting other organ systems such as joints (Psoriatic Arthritis).  It is linked with heart disease, inflammatory bowel disease, non-alcoholic fatty liver disease, and depression. Significant skin psoriasis and/or psoriatic arthritis may have significant symptoms and affects activities of daily activity and often benefits from systemic "biologic" injection treatments.  These "biologic" treatments have some potential side effects including immunosuppression and require pre-treatment laboratory screening and periodic laboratory monitoring and periodic in person evaluation and monitoring by the attending dermatologist physician (long term medication management).    Pending labs, Cont Skyrizi sq  injections q 12 weeks Cont DermaSmooth FS oil qd prn flares to ears, scalp Cont Luxiq foam qd aa scalp prn flares Cont Elidel cr qd/bid to face/body prn flares   Samples of Vtama cream and Zoryve cream given use qd to aas post-auricular  Topical steroids (such as triamcinolone, fluocinolone, fluocinonide, mometasone, clobetasol, halobetasol, betamethasone, hydrocortisone) can cause thinning and lightening of the skin if they are used for too long in the same area. Your physician has selected the right strength medicine for your problem and area affected on the body. Please use your medication only as directed by your physician to prevent side effects.    Lab ordered CMP, CBC with diff, QuantiFeron TB-gold   Comprehensive metabolic panel - frontal hairline, post auricular  CBC with Differential/Platelet - frontal hairline, post auricular  QuantiFERON-TB Gold Plus - frontal hairline, post auricular  Elastosis of skin face  Botox 30 units injected today to: - Frown complex 20 units - Forehead 10 units     Intralesional injection - face Location: See attached image  Informed consent: Discussed risks (infection, pain, bleeding, bruising, swelling, allergic reaction, paralysis of nearby muscles, eyelid droop, double vision, neck weakness, difficulty breathing, headache, undesirable cosmetic result, and need for additional treatment) and benefits of the procedure, as well as the alternatives.  Informed consent was obtained.  Preparation: The area was cleansed with alcohol.  Procedure Details:  Botox was injected into the dermis with a 30-gauge needle. Pressure applied to any bleeding. Ice packs offered for swelling.  Lot Number:  [id number] Expiration:  11/06/2024  Total Units Injected:  30  Plan: Tylenol may be used for headache.  Allow 2 weeks before returning to clinic for additional dosing as needed. Patient will call for any  problems.    Return in about 3 months (around  11/25/2022) for Botox and 6 months f/u Psoriasis.  I, Marye Round, CMA, am acting as scribe for Brendolyn Patty, MD .   Documentation: I have reviewed the above documentation for accuracy and completeness, and I agree with the above.  Brendolyn Patty MD

## 2022-09-10 ENCOUNTER — Telehealth: Payer: Self-pay

## 2022-09-10 LAB — CBC WITH DIFFERENTIAL/PLATELET
Basophils Absolute: 0 10*3/uL (ref 0.0–0.2)
Basos: 1 %
EOS (ABSOLUTE): 0.1 10*3/uL (ref 0.0–0.4)
Eos: 2 %
Hematocrit: 42.4 % (ref 34.0–46.6)
Hemoglobin: 14.3 g/dL (ref 11.1–15.9)
Immature Grans (Abs): 0 10*3/uL (ref 0.0–0.1)
Immature Granulocytes: 0 %
Lymphocytes Absolute: 1.9 10*3/uL (ref 0.7–3.1)
Lymphs: 40 %
MCH: 31.2 pg (ref 26.6–33.0)
MCHC: 33.7 g/dL (ref 31.5–35.7)
MCV: 92 fL (ref 79–97)
Monocytes Absolute: 0.3 10*3/uL (ref 0.1–0.9)
Monocytes: 7 %
Neutrophils Absolute: 2.5 10*3/uL (ref 1.4–7.0)
Neutrophils: 50 %
Platelets: 281 10*3/uL (ref 150–450)
RBC: 4.59 x10E6/uL (ref 3.77–5.28)
RDW: 12.8 % (ref 11.7–15.4)
WBC: 4.8 10*3/uL (ref 3.4–10.8)

## 2022-09-10 LAB — QUANTIFERON-TB GOLD PLUS
QuantiFERON Mitogen Value: >10 IU/mL
QuantiFERON Nil Value: 0.05 IU/mL
QuantiFERON TB1 Ag Value: 0.05 IU/mL
QuantiFERON TB2 Ag Value: 0.05 IU/mL
QuantiFERON-TB Gold Plus: NEGATIVE

## 2022-09-10 LAB — COMPREHENSIVE METABOLIC PANEL
ALT: 15 IU/L (ref 0–32)
AST: 13 IU/L (ref 0–40)
Albumin/Globulin Ratio: 2.1 (ref 1.2–2.2)
Albumin: 4.2 g/dL (ref 3.9–4.9)
Alkaline Phosphatase: 59 IU/L (ref 44–121)
BUN/Creatinine Ratio: 12 (ref 9–23)
BUN: 13 mg/dL (ref 6–24)
Bilirubin Total: 0.3 mg/dL (ref 0.0–1.2)
CO2: 22 mmol/L (ref 20–29)
Calcium: 8.9 mg/dL (ref 8.7–10.2)
Chloride: 106 mmol/L (ref 96–106)
Creatinine, Ser: 1.1 mg/dL — ABNORMAL HIGH (ref 0.57–1.00)
Globulin, Total: 2 g/dL (ref 1.5–4.5)
Glucose: 85 mg/dL (ref 70–99)
Potassium: 4.2 mmol/L (ref 3.5–5.2)
Sodium: 142 mmol/L (ref 134–144)
Total Protein: 6.2 g/dL (ref 6.0–8.5)
eGFR: 62 mL/min/{1.73_m2} (ref >59)

## 2022-09-10 MED ORDER — SKYRIZI 150 MG/ML ~~LOC~~ SOSY: PREFILLED_SYRINGE | SUBCUTANEOUS | 3 refills | Status: DC

## 2022-09-10 NOTE — Telephone Encounter (Signed)
Left message for patient to call for lab results. Skyrizi refills have been sent to pharmacy.

## 2022-09-10 NOTE — Telephone Encounter (Signed)
-----   Message from Brendolyn Patty, MD sent at 09/10/2022  8:38 AM EST ----- Labs are ok.  TB negative.  Cr level is slightly elevated, she may have been dehydrated, or could be due to other medication (NSAIDs), not likely due to [employer].  She can address with PCP to see if further evaluation is necessary.  Send if 6 mos rfs Skyrizi - please call patient

## 2022-09-11 NOTE — Telephone Encounter (Signed)
Advised pt of lab results and advised refills of Skyrizi have been sent to pharmacy.Mariana Kaufman

## 2022-11-24 ENCOUNTER — Ambulatory Visit (INDEPENDENT_AMBULATORY_CARE_PROVIDER_SITE_OTHER): Payer: Self-pay | Admitting: Dermatology

## 2022-11-24 VITALS — BP 114/77 | HR 80

## 2022-11-24 DIAGNOSIS — L988 Other specified disorders of the skin and subcutaneous tissue: Secondary | ICD-10-CM

## 2022-11-24 NOTE — Patient Instructions (Signed)
Due to recent changes in healthcare laws, you may see results of your pathology and/or laboratory studies on MyChart before the doctors have had a chance to review them. We understand that in some cases there may be results that are confusing or concerning to you. Please understand that not all results are received at the same time and often the doctors may need to interpret multiple results in order to provide you with the best plan of care or course of treatment. Therefore, we ask that you please give us 2 business days to thoroughly review all your results before contacting the office for clarification. Should we see a critical lab result, you will be contacted sooner.   If You Need Anything After Your Visit  If you have any questions or concerns for your doctor, please call our main line at [phone] and press option 4 to reach your doctor's medical assistant. If no one answers, please leave a voicemail as directed and we will return your call as soon as possible. Messages left after 4 pm will be answered the following business day.   You may also send us a message via MyChart. We typically respond to MyChart messages within 1-2 business days.  For prescription refills, please ask your pharmacy to contact our office. Our fax number is [phone].  If you have an urgent issue when the clinic is closed that cannot wait until the next business day, you can page your doctor at the number below.    Please note that while we do our best to be available for urgent issues outside of office hours, we are not available 24/7.   If you have an urgent issue and are unable to reach us, you may choose to seek medical care at your doctor's office, retail clinic, urgent care center, or emergency room.  If you have a medical emergency, please immediately call 911 or go to the emergency department.  Pager Numbers  - Dr. Kowalski: [phone]  - Dr. Moye: [phone]  - Dr. Stewart:  [phone]  In the event of inclement weather, please call our main line at [phone] for an update on the status of any delays or closures.  Dermatology Medication Tips: Please keep the boxes that topical medications come in in order to help keep track of the instructions about where and how to use these. Pharmacies typically print the medication instructions only on the boxes and not directly on the medication tubes.   If your medication is too expensive, please contact our office at [phone] option 4 or send us a message through MyChart.   We are unable to tell what your co-pay for medications will be in advance as this is different depending on your insurance coverage. However, we may be able to find a substitute medication at lower cost or fill out paperwork to get insurance to cover a needed medication.   If a prior authorization is required to get your medication covered by your insurance company, please allow us 1-2 business days to complete this process.  Drug prices often vary depending on where the prescription is filled and some pharmacies may offer cheaper prices.  The website www.goodrx.com contains coupons for medications through different pharmacies. The prices here do not account for what the cost may be with help from insurance (it may be cheaper with your insurance), but the website can give you the price if you did not use any insurance.  - You can print the associated coupon and take it with   your prescription to the pharmacy.  - You may also stop by our office during regular business hours and pick up a GoodRx coupon card.  - If you need your prescription sent electronically to a different pharmacy, notify our office through [facility] MyChart or by phone at [phone] option 4.     Si Usted Necesita Algo Despus de Su Visita  Tambin puede enviarnos un mensaje a travs de MyChart. Por lo general respondemos a los mensajes de MyChart en el transcurso de 1 a 2  das hbiles.  Para renovar recetas, por favor pida a su farmacia que se ponga en contacto con nuestra oficina. Nuestro nmero de fax es el [phone].  Si tiene un asunto urgente cuando la clnica est cerrada y que no puede esperar hasta el siguiente da hbil, puede llamar/localizar a su doctor(a) al nmero que aparece a continuacin.   Por favor, tenga en cuenta que aunque hacemos todo lo posible para estar disponibles para asuntos urgentes fuera del horario de oficina, no estamos disponibles las 24 horas del da, los 7 das de la semana.   Si tiene un problema urgente y no puede comunicarse con nosotros, puede optar por buscar atencin mdica  en el consultorio de su doctor(a), en una clnica privada, en un centro de atencin urgente o en una sala de emergencias.  Si tiene una emergencia mdica, por favor llame inmediatamente al 911 o vaya a la sala de emergencias.  Nmeros de bper  - Dr. Kowalski: [phone]  - Dra. Moye: [phone]  - Dra. Stewart: [phone]  En caso de inclemencias del tiempo, por favor llame a nuestra lnea principal al [phone] para una actualizacin sobre el estado de cualquier retraso o cierre.  Consejos para la medicacin en dermatologa: Por favor, guarde las cajas en las que vienen los medicamentos de uso tpico para ayudarle a seguir las instrucciones sobre dnde y cmo usarlos. Las farmacias generalmente imprimen las instrucciones del medicamento slo en las cajas y no directamente en los tubos del medicamento.   Si su medicamento es muy caro, por favor, pngase en contacto con nuestra oficina llamando al [phone] y presione la opcin 4 o envenos un mensaje a travs de MyChart.   No podemos decirle cul ser su copago por los medicamentos por adelantado ya que esto es diferente dependiendo de la cobertura de su seguro. Sin embargo, es posible que podamos encontrar un medicamento sustituto a menor costo o llenar un formulario para que el  seguro cubra el medicamento que se considera necesario.   Si se requiere una autorizacin previa para que su compaa de seguros cubra su medicamento, por favor permtanos de 1 a 2 das hbiles para completar este proceso.  Los precios de los medicamentos varan con frecuencia dependiendo del lugar de dnde se surte la receta y alguna farmacias pueden ofrecer precios ms baratos.  El sitio web www.goodrx.com tiene cupones para medicamentos de diferentes farmacias. Los precios aqu no tienen en cuenta lo que podra costar con la ayuda del seguro (puede ser ms barato con su seguro), pero el sitio web puede darle el precio si no utiliz ningn seguro.  - Puede imprimir el cupn correspondiente y llevarlo con su receta a la farmacia.  - Tambin puede pasar por nuestra oficina durante el horario de atencin regular y recoger una tarjeta de cupones de GoodRx.  - Si necesita que su receta se enve electrnicamente a una farmacia diferente, informe a nuestra oficina a travs de MyChart de [facility]   o por telfono llamando al [phone] y presione la opcin 4.  

## 2022-11-24 NOTE — Progress Notes (Signed)
   Follow-Up Visit   Subjective  Shardea Richmond is a 47 y.o. female who presents for the following: Procedure (Patient presents for Botox injections to the frown complex and forehead. ).   The following portions of the chart were reviewed this encounter and updated as appropriate:       Review of Systems:  No other skin or systemic complaints except as noted in HPI or Assessment and Plan.  Objective  Well appearing patient in no apparent distress; mood and affect are within normal limits.  A focused examination was performed including face. Relevant physical exam findings are noted in the Assessment and Plan.  face Rhytides and volume loss.        Assessment & Plan  Elastosis of skin face  Botox 30 units injected today to: - Frown complex 20 units - Forehead 10 units  Botox Injection - face Location: See attached image  Informed consent: Discussed risks (infection, pain, bleeding, bruising, swelling, allergic reaction, paralysis of nearby muscles, eyelid droop, double vision, neck weakness, difficulty breathing, headache, undesirable cosmetic result, and need for additional treatment) and benefits of the procedure, as well as the alternatives.  Informed consent was obtained.  Preparation: The area was cleansed with alcohol.  Procedure Details:  Botox was injected into the dermis with a 30-gauge needle. Pressure applied to any bleeding. Ice packs offered for swelling.  Lot Number:  [id number] Expiration:  12/2024  Total Units Injected:  30  Plan: Patient was instructed to remain upright for 4 hours. Patient was instructed to avoid massaging the face and avoid vigorous exercise for the rest of the day. Tylenol may be used for headache.  Allow 2 weeks before returning to clinic for additional dosing as needed. Patient will call for any problems.    Return in about 4 months (around 03/25/2023) for botox.  IJamesetta Orleans, CMA, am acting as scribe for Brendolyn Patty,  MD .  Documentation: I have reviewed the above documentation for accuracy and completeness, and I agree with the above.  Brendolyn Patty MD

## 2022-12-23 ENCOUNTER — Other Ambulatory Visit: Payer: Self-pay | Admitting: Obstetrics and Gynecology

## 2022-12-23 DIAGNOSIS — Z1231 Encounter for screening mammogram for malignant neoplasm of breast: Secondary | ICD-10-CM

## 2022-12-23 LAB — RESULTS CONSOLE HPV: CHL HPV: NEGATIVE

## 2022-12-23 LAB — HM PAP SMEAR: HM Pap smear: NEGATIVE

## 2023-02-27 ENCOUNTER — Ambulatory Visit
Admission: RE | Admit: 2023-02-27 | Discharge: 2023-02-27 | Disposition: A | Discharge status: Home / Self Care | Payer: 59 | Source: Ambulatory Visit | Attending: Obstetrics and Gynecology | Admitting: Obstetrics and Gynecology

## 2023-02-27 DIAGNOSIS — Z1231 Encounter for screening mammogram for malignant neoplasm of breast: Secondary | ICD-10-CM | POA: Insufficient documentation

## 2023-03-16 ENCOUNTER — Ambulatory Visit: Payer: 59 | Admitting: Dermatology

## 2023-03-16 VITALS — BP 124/83 | HR 74

## 2023-03-16 DIAGNOSIS — Z79899 Other long term (current) drug therapy: Secondary | ICD-10-CM | POA: Diagnosis not present

## 2023-03-16 DIAGNOSIS — L409 Psoriasis, unspecified: Secondary | ICD-10-CM

## 2023-03-16 DIAGNOSIS — L988 Other specified disorders of the skin and subcutaneous tissue: Secondary | ICD-10-CM

## 2023-03-16 NOTE — Patient Instructions (Addendum)
Due to recent changes in healthcare laws, you may see results of your pathology and/or laboratory studies on MyChart before the doctors have had a chance to review them. We understand that in some cases there may be results that are confusing or concerning to you. Please understand that not all results are received at the same time and often the doctors may need to interpret multiple results in order to provide you with the best plan of care or course of treatment. Therefore, we ask that you please give us 2 business days to thoroughly review all your results before contacting the office for clarification. Should we see a critical lab result, you will be contacted sooner.   If You Need Anything After Your Visit  If you have any questions or concerns for your doctor, please call our main line at [phone] and press option 4 to reach your doctor's medical assistant. If no one answers, please leave a voicemail as directed and we will return your call as soon as possible. Messages left after 4 pm will be answered the following business day.   You may also send us a message via MyChart. We typically respond to MyChart messages within 1-2 business days.  For prescription refills, please ask your pharmacy to contact our office. Our fax number is [phone].  If you have an urgent issue when the clinic is closed that cannot wait until the next business day, you can page your doctor at the number below.    Please note that while we do our best to be available for urgent issues outside of office hours, we are not available 24/7.   If you have an urgent issue and are unable to reach us, you may choose to seek medical care at your doctor's office, retail clinic, urgent care center, or emergency room.  If you have a medical emergency, please immediately call 911 or go to the emergency department.  Pager Numbers  - Dr. Kowalski: [phone]  - Dr. Moye: [phone]  - Dr. Stewart:  [phone]  In the event of inclement weather, please call our main line at [phone] for an update on the status of any delays or closures.  Dermatology Medication Tips: Please keep the boxes that topical medications come in in order to help keep track of the instructions about where and how to use these. Pharmacies typically print the medication instructions only on the boxes and not directly on the medication tubes.   If your medication is too expensive, please contact our office at [phone] option 4 or send us a message through MyChart.   We are unable to tell what your co-pay for medications will be in advance as this is different depending on your insurance coverage. However, we may be able to find a substitute medication at lower cost or fill out paperwork to get insurance to cover a needed medication.   If a prior authorization is required to get your medication covered by your insurance company, please allow us 1-2 business days to complete this process.  Drug prices often vary depending on where the prescription is filled and some pharmacies may offer cheaper prices.  The website www.goodrx.com contains coupons for medications through different pharmacies. The prices here do not account for what the cost may be with help from insurance (it may be cheaper with your insurance), but the website can give you the price if you did not use any insurance.  - You can print the associated coupon and take it with   your prescription to the pharmacy.  - You may also stop by our office during regular business hours and pick up a GoodRx coupon card.  - If you need your prescription sent electronically to a different pharmacy, notify our office through [facility] MyChart or by phone at [phone] option 4.     Si Usted Necesita Algo Despus de Su Visita  Tambin puede enviarnos un mensaje a travs de MyChart. Por lo general respondemos a los mensajes de MyChart en el transcurso de 1 a 2  das hbiles.  Para renovar recetas, por favor pida a su farmacia que se ponga en contacto con nuestra oficina. Nuestro nmero de fax es el [phone].  Si tiene un asunto urgente cuando la clnica est cerrada y que no puede esperar hasta el siguiente da hbil, puede llamar/localizar a su doctor(a) al nmero que aparece a continuacin.   Por favor, tenga en cuenta que aunque hacemos todo lo posible para estar disponibles para asuntos urgentes fuera del horario de oficina, no estamos disponibles las 24 horas del da, los 7 das de la semana.   Si tiene un problema urgente y no puede comunicarse con nosotros, puede optar por buscar atencin mdica  en el consultorio de su doctor(a), en una clnica privada, en un centro de atencin urgente o en una sala de emergencias.  Si tiene una emergencia mdica, por favor llame inmediatamente al 911 o vaya a la sala de emergencias.  Nmeros de bper  - Dr. Kowalski: [phone]  - Dra. Moye: [phone]  - Dra. Stewart: [phone]  En caso de inclemencias del tiempo, por favor llame a nuestra lnea principal al [phone] para una actualizacin sobre el estado de cualquier retraso o cierre.  Consejos para la medicacin en dermatologa: Por favor, guarde las cajas en las que vienen los medicamentos de uso tpico para ayudarle a seguir las instrucciones sobre dnde y cmo usarlos. Las farmacias generalmente imprimen las instrucciones del medicamento slo en las cajas y no directamente en los tubos del medicamento.   Si su medicamento es muy caro, por favor, pngase en contacto con nuestra oficina llamando al [phone] y presione la opcin 4 o envenos un mensaje a travs de MyChart.   No podemos decirle cul ser su copago por los medicamentos por adelantado ya que esto es diferente dependiendo de la cobertura de su seguro. Sin embargo, es posible que podamos encontrar un medicamento sustituto a menor costo o llenar un formulario para que el  seguro cubra el medicamento que se considera necesario.   Si se requiere una autorizacin previa para que su compaa de seguros cubra su medicamento, por favor permtanos de 1 a 2 das hbiles para completar este proceso.  Los precios de los medicamentos varan con frecuencia dependiendo del lugar de dnde se surte la receta y alguna farmacias pueden ofrecer precios ms baratos.  El sitio web www.goodrx.com tiene cupones para medicamentos de diferentes farmacias. Los precios aqu no tienen en cuenta lo que podra costar con la ayuda del seguro (puede ser ms barato con su seguro), pero el sitio web puede darle el precio si no utiliz ningn seguro.  - Puede imprimir el cupn correspondiente y llevarlo con su receta a la farmacia.  - Tambin puede pasar por nuestra oficina durante el horario de atencin regular y recoger una tarjeta de cupones de GoodRx.  - Si necesita que su receta se enve electrnicamente a una farmacia diferente, informe a nuestra oficina a travs de MyChart de [facility]   o por telfono llamando al [phone] y presione la opcin 4.  

## 2023-03-16 NOTE — Progress Notes (Signed)
Follow-Up Visit   Subjective  Andrea Goodman is a 47 y.o. female who presents for the following: Psoriasis 6 month psoriasis follow up currently taking skyrizi injections subq every 3 months. and 3 month botox follow up.  No side effects from the [place].  Gets itchy about 2 weeks before her next shot.  The following portions of the chart were reviewed this encounter and updated as appropriate: medications, allergies, medical history  Review of Systems:  No other skin or systemic complaints except as noted in HPI or Assessment and Plan.  Objective  Well appearing patient in no apparent distress; mood and affect are within normal limits.  Areas Examined: Face, scalp, ears,   Relevant exam findings are noted in the Assessment and Plan.      Assessment & Plan    FACIAL ELASTOSIS Exam: Rhytides and volume loss.  Treatment Plan:   Recommend daily broad spectrum sunscreen SPF 30+ to sun-exposed areas, reapply every 2 hours as needed. Call for new or changing lesions.  Staying in the shade or wearing long sleeves, sun glasses (UVA+UVB protection) and wide brim hats (4-inch brim around the entire circumference of the hat) are also recommended for sun protection.   Facial Elastosis  Location:  forehead, glabella, BL comma, crow's feet  Informed consent: Discussed risks (infection, pain, bleeding, bruising, swelling, allergic reaction, paralysis of nearby muscles, eyelid droop, double vision, neck weakness, difficulty breathing, headache, undesirable cosmetic result, and need for additional treatment) and benefits of the procedure, as well as the alternatives.  Informed consent was obtained.  Preparation: The area was cleansed with alcohol.  Procedure Details:  Botox was injected into the dermis with a 30-gauge needle. Pressure applied to any bleeding. Ice packs offered for swelling.  Lot Number:  [id number] Expiration:  02/2025  Total Units Injected:  42.50  Plan: Tylenol  may be used for headache.  Allow 2 weeks before returning to clinic for additional dosing as needed. Patient will call for any problems.    PSORIASIS frontal hairline, post auricular Exam:  Mild erythema and scale at postauricular crease and ear canals.  BSA<1%  Chronic condition with duration or expected duration over one year. Currently well-controlled on Skyrizi. Some itching 2 weeks prior to each injection.  Counseling and coordination of care for severe psoriasis on systemic treatment  psoriasis - severe on systemic treatment.  Psoriasis is a chronic non-curable, but treatable genetic/hereditary disease that may have other systemic features affecting other organ systems such as joints (Psoriatic Arthritis).  It is linked with heart disease, inflammatory bowel disease, non-alcoholic fatty liver disease, and depression. Significant skin psoriasis and/or psoriatic arthritis may have significant symptoms and affects activities of daily activity and often benefits from systemic treatments.  These systemic treatments have some potential side effects including immunosuppression and require pre-treatment laboratory screening and periodic laboratory monitoring and periodic in person evaluation and monitoring by the attending dermatologist physician (long term medication management).    Treatment Plan: Discussed swtiching to [place] for every 2 month dosing to decrease itching at end of 3 mo dosing cycle Patient is hesitant to change, will let us know if she changes her mind  Cont Skyrizi sq injections q 12 weeks Cont DermaSmooth FS oil qd prn flares to ears, scalp Cont Luxiq foam qd aa scalp prn flares Cont Elidel cr qd/bid to face/body prn flares  Will recheck labs at next follow up  Topical steroids (such as triamcinolone, fluocinolone, fluocinonide, mometasone, clobetasol, halobetasol, betamethasone, hydrocortisone) can cause  thinning and lightening of the skin if they are used for too long  in the same area. Your physician has selected the right strength medicine for your problem and area affected on the body. Please use your medication only as directed by your physician to prevent side effects.    Reviewed risks of biologics including immunosuppression, infections, injection site reaction, and failure to improve condition. Goal is control of skin condition, not cure.  Some older biologics such as Humira and Enbrel may slightly increase risk of malignancy and may worsen congestive heart failure.  Taltz and Cosentyx may cause inflammatory bowel disease to flare. The use of biologics requires long term medication management, including periodic office visits and monitoring of blood work.     Return for 6 month psoriasis follow up, 3-4 month botox follow up.  I, Andrea Goodman, CMA, am acting as scribe for Andrea Niece, MD.   Documentation: I have reviewed the above documentation for accuracy and completeness, and I agree with the above.  Andrea Niece, MD

## 2023-08-11 ENCOUNTER — Other Ambulatory Visit: Payer: Self-pay | Admitting: Dermatology

## 2023-08-17 ENCOUNTER — Ambulatory Visit: Payer: 59 | Admitting: Dermatology

## 2023-09-21 ENCOUNTER — Ambulatory Visit: Payer: 59 | Admitting: Dermatology

## 2023-09-21 DIAGNOSIS — L409 Psoriasis, unspecified: Secondary | ICD-10-CM

## 2023-09-21 DIAGNOSIS — Z5181 Encounter for therapeutic drug level monitoring: Secondary | ICD-10-CM | POA: Diagnosis not present

## 2023-09-21 DIAGNOSIS — Z79899 Other long term (current) drug therapy: Secondary | ICD-10-CM | POA: Diagnosis not present

## 2023-09-21 MED ORDER — VTAMA 1 % EX CREA: 1.0000 | TOPICAL_CREAM | Freq: Every day | CUTANEOUS | 6 refills | Status: DC

## 2023-09-21 NOTE — Progress Notes (Signed)
Follow-Up Visit   Subjective  Andrea Goodman is a 47 y.o. female who presents for the following: Psoriasis scalp, post auricular, 41m f/u, Skyrizi q 12 wks, Derma-Smooth FS oil prn, Luxiq foam scalp prn flares, Elidel cr prn flares, pt feels like Skyrizi not working as well as it did starts to flare (itch) during month before next injection due.  Still not as bad as it was off of it. She has some joint pain in wrists. The patient has spots, moles and lesions to be evaluated, some may be new or changing and the patient may have concern these could be cancer.   The following portions of the chart were reviewed this encounter and updated as appropriate: medications, allergies, medical history  Review of Systems:  No other skin or systemic complaints except as noted in HPI or Assessment and Plan.  Objective  Well appearing patient in no apparent distress; mood and affect are within normal limits.   A focused examination was performed of the following areas: Face, scalp, ears  Relevant exam findings are noted in the Assessment and Plan.    Assessment & Plan   PSORIASIS on Systemic Treatment Scalp, ears, elbows Exam: mild erythema and xerosis post auricular R > L, erythema with xerosis bil elbows 1% BSA.  Chronic and persistent condition with duration or expected duration over one year. Condition is symptomatic/ bothersome to patient. Not currently at goal.   Patient has joint pain sometimes in wrist  Labs due  Psoriasis - severe on systemic treatment.  Psoriasis is a chronic non-curable, but treatable genetic/hereditary disease that may have other systemic features affecting other organ systems such as joints (Psoriatic Arthritis).  It is linked with heart disease, inflammatory bowel disease, non-alcoholic fatty liver disease, and depression. Significant skin psoriasis and/or psoriatic arthritis may have significant symptoms and affects activities of daily activity and often  benefits from systemic treatments.  These systemic treatments have some potential side effects including immunosuppression and require pre-treatment laboratory screening and periodic laboratory monitoring and periodic in person evaluation and monitoring by the attending dermatologist physician (long term medication management).   Treatment Plan: Cont Skyrizi sq injections q 12 wks pending labs Start Vtama cr qd aa prn flares (use in place of Elidel) Cont Derma-Smooth FS oil qd scalp, ears prn flares Cont Luxiq foam qd aa scalp prn flares May cont Elidel qd aa face, body prn flares  Reviewed risks of biologics including immunosuppression, infections, injection site reaction, and failure to improve condition. Goal is control of skin condition, not cure.  Some older biologics such as Humira and Enbrel may slightly increase risk of malignancy and may worsen congestive heart failure.  Taltz and Cosentyx may cause inflammatory bowel disease to flare. The use of biologics requires long term medication management, including periodic office visits and monitoring of blood work.   Topical steroids (such as triamcinolone, fluocinolone, fluocinonide, mometasone, clobetasol, halobetasol, betamethasone, hydrocortisone) can cause thinning and lightening of the skin if they are used for too long in the same area. Your physician has selected the right strength medicine for your problem and area affected on the body. Please use your medication only as directed by your physician to prevent side effects.    Long term medication management.  Patient is using long term (months to years) prescription medication  to control their dermatologic condition.  These medications require periodic monitoring to evaluate for efficacy and side effects and may require periodic laboratory monitoring.    PSORIASIS  Related Procedures Comprehensive metabolic panel CBC with Differential/Platelet QuantiFERON-TB Gold Plus Lipid  Panel Thyroid Panel With TSH  Return in about 6 months (around 03/21/2024) for Psoriasis f/u.  I, Ardis Rowan, RMA, am acting as scribe for Willeen Niece, MD .   Documentation: I have reviewed the above documentation for accuracy and completeness, and I agree with the above.  Willeen Niece, MD

## 2023-09-21 NOTE — Patient Instructions (Addendum)
 Your prescription was sent to [facility] in [place]. A representative from [facility] will contact you within 3 business hours to verify your address and insurance information to schedule a free delivery. If for any reason you do not receive a phone call from them, please reach out to them. Their phone number is [phone] and their hours are Monday-Friday 9:00 am-5:00 pm.     Due to recent changes in healthcare laws, you may see results of your pathology and/or laboratory studies on MyChart before the doctors have had a chance to review them. We understand that in some cases there may be results that are confusing or concerning to you. Please understand that not all results are received at the same time and often the doctors may need to interpret multiple results in order to provide you with the best plan of care or course of treatment. Therefore, we ask that you please give [place] 2 business days to thoroughly review all your results before contacting the office for clarification. Should we see a critical lab result, you will be contacted sooner.   If You Need Anything After Your Visit  If you have any questions or concerns for your doctor, please call our main line at [phone] and press option 4 to reach your doctor's medical assistant. If no one answers, please leave a voicemail as directed and we will return your call as soon as possible. Messages left after 4 pm will be answered the following business day.   You may also send [place] a message via MyChart. We typically respond to MyChart messages within 1-2 business days.  For prescription refills, please ask your pharmacy to contact our office. Our fax number is [phone].  If you have an urgent issue when the clinic is closed that cannot wait until the next business day, you can page your doctor at the number below.    Please note that while we do our best to be available for urgent issues outside of office hours, we are not  available 24/7.   If you have an urgent issue and are unable to reach [place], you may choose to seek medical care at your doctor's office, retail clinic, urgent care center, or emergency room.  If you have a medical emergency, please immediately call 911 or go to the emergency department.  Pager Numbers  - Dr. Gwen Pounds: [phone]  - Dr. Roseanne Reno: [phone]  - Dr. Katrinka Blazing: [phone]   In the event of inclement weather, please call our main line at [phone] for an update on the status of any delays or closures.  Dermatology Medication Tips: Please keep the boxes that topical medications come in in order to help keep track of the instructions about where and how to use these. Pharmacies typically print the medication instructions only on the boxes and not directly on the medication tubes.   If your medication is too expensive, please contact our office at [phone] option 4 or send [place] a message through MyChart.   We are unable to tell what your co-pay for medications will be in advance as this is different depending on your insurance coverage. However, we may be able to find a substitute medication at lower cost or fill out paperwork to get insurance to cover a needed medication.   If a prior authorization is required to get your medication covered by your insurance company, please allow [place] 1-2 business days to complete this process.  Drug prices often vary depending on where the prescription is  filled and some pharmacies may offer cheaper prices.  The website www.goodrx.com contains coupons for medications through different pharmacies. The prices here do not account for what the cost may be with help from insurance (it may be cheaper with your insurance), but the website can give you the price if you did not use any insurance.  - You can print the associated coupon and take it with your prescription to the pharmacy.  - You may also stop by our office during regular business hours  and pick up a GoodRx coupon card.  - If you need your prescription sent electronically to a different pharmacy, notify our office through [facility] or by phone at [phone] option 4.     Si Usted Necesita Algo Despus de Su Visita  Tambin puede enviarnos un mensaje a travs de Clinical cytogeneticist. Por lo general respondemos a los mensajes de MyChart en el transcurso de 1 a 2 das hbiles.  Para renovar recetas, por favor pida a su farmacia que se ponga en contacto con nuestra oficina. Annie Sable de fax es [place] [phone].  Si tiene un asunto urgente cuando la clnica est cerrada y que no puede esperar hasta el siguiente da hbil, puede llamar/localizar a su doctor(a) al nmero que aparece a continuacin.   Por favor, tenga en cuenta que aunque hacemos todo lo posible para estar disponibles para asuntos urgentes fuera del horario de [place], no estamos disponibles las 24 horas del da, los 7 [address]  [address] de la [place].   Si tiene un problema urgente y no puede comunicarse con nosotros, puede optar por buscar atencin mdica  en el consultorio de su doctor(a), en una clnica privada, en un centro de atencin urgente o en una sala de emergencias.  Si tiene Engineer, drilling, por favor llame inmediatamente al 911 o vaya a la sala de emergencias.  Nmeros de bper  - Dr. Gwen Pounds: [phone]  - Dra. Roseanne Reno: [phone]  - Dr. Katrinka Blazing: [phone]   En caso de inclemencias del tiempo, por favor llame a Lacy Duverney principal al [phone] para una actualizacin sobre el [place] de cualquier retraso o cierre.  Consejos para la medicacin en dermatologa: Por favor, guarde las cajas en las que vienen los medicamentos de uso tpico para ayudarle a seguir las instrucciones sobre dnde y cmo usarlos. Las farmacias generalmente imprimen las instrucciones del medicamento slo en las cajas y no directamente en los tubos del [place].   Si su medicamento es muy caro, por favor, pngase  en contacto con Rolm Gala llamando al [phone] y presione la opcin 4 o envenos un mensaje a travs de Clinical cytogeneticist.   No podemos decirle cul ser su copago por los medicamentos por adelantado ya que esto es diferente dependiendo de la cobertura de su seguro. Sin embargo, es posible que podamos encontrar un medicamento sustituto a Audiological scientist un formulario para que el seguro cubra el medicamento que se considera necesario.   Si se requiere una autorizacin previa para que su compaa de seguros [place] su medicamento, por favor permtanos de 1 a 2 das hbiles para completar [address]  Los precios de los medicamentos varan con frecuencia dependiendo del Environmental consultant de dnde se surte la receta y alguna farmacias pueden ofrecer precios ms baratos.  El sitio web www.goodrx.com tiene cupones para medicamentos de Health and safety inspector. Los precios aqu no tienen en cuenta lo que podra costar con la ayuda del seguro (puede ser ms barato con su seguro), pero el sitio web puede  darle el precio si no utiliz [employer].  - Puede imprimir el cupn correspondiente y llevarlo con su receta a la farmacia.  - Tambin puede pasar por nuestra oficina durante el horario de atencin regular y Education officer, museum una tarjeta de cupones de GoodRx.  - Si necesita que su receta se enve electrnicamente a una farmacia diferente, informe a nuestra oficina a travs de MyChart de [place] o por telfono llamando al [phone] y presione la opcin 4.

## 2023-09-25 LAB — COMPREHENSIVE METABOLIC PANEL
ALT: 14 [IU]/L (ref 0–32)
AST: 15 [IU]/L (ref 0–40)
Albumin: 4.6 g/dL (ref 3.9–4.9)
Alkaline Phosphatase: 53 [IU]/L (ref 44–121)
BUN/Creatinine Ratio: 20 (ref 9–23)
BUN: 17 mg/dL (ref 6–24)
Bilirubin Total: 0.4 mg/dL (ref 0.0–1.2)
CO2: 21 mmol/L (ref 20–29)
Calcium: 9.2 mg/dL (ref 8.7–10.2)
Chloride: 103 mmol/L (ref 96–106)
Creatinine, Ser: 0.83 mg/dL (ref 0.57–1.00)
Globulin, Total: 2.4 g/dL (ref 1.5–4.5)
Glucose: 73 mg/dL (ref 70–99)
Potassium: 3.9 mmol/L (ref 3.5–5.2)
Sodium: 139 mmol/L (ref 134–144)
Total Protein: 7 g/dL (ref 6.0–8.5)
eGFR: 87 mL/min/{1.73_m2} (ref >59)

## 2023-09-25 LAB — QUANTIFERON-TB GOLD PLUS
QuantiFERON Mitogen Value: 4.52 [IU]/mL
QuantiFERON Nil Value: 0.05 [IU]/mL
QuantiFERON TB1 Ag Value: 0.02 [IU]/mL
QuantiFERON TB2 Ag Value: 0.04 [IU]/mL
QuantiFERON-TB Gold Plus: NEGATIVE

## 2023-09-25 LAB — CBC WITH DIFFERENTIAL/PLATELET
Basophils Absolute: 0 10*3/uL (ref 0.0–0.2)
Basos: 1 %
EOS (ABSOLUTE): 0.1 10*3/uL (ref 0.0–0.4)
Eos: 2 %
Hematocrit: 44.4 % (ref 34.0–46.6)
Hemoglobin: 14.5 g/dL (ref 11.1–15.9)
Immature Grans (Abs): 0 10*3/uL (ref 0.0–0.1)
Immature Granulocytes: 0 %
Lymphocytes Absolute: 2.2 10*3/uL (ref 0.7–3.1)
Lymphs: 46 %
MCH: 30.6 pg (ref 26.6–33.0)
MCHC: 32.7 g/dL (ref 31.5–35.7)
MCV: 94 fL (ref 79–97)
Monocytes Absolute: 0.3 10*3/uL (ref 0.1–0.9)
Monocytes: 6 %
Neutrophils Absolute: 2.3 10*3/uL (ref 1.4–7.0)
Neutrophils: 45 %
Platelets: 293 10*3/uL (ref 150–450)
RBC: 4.74 x10E6/uL (ref 3.77–5.28)
RDW: 12.7 % (ref 11.7–15.4)
WBC: 4.9 10*3/uL (ref 3.4–10.8)

## 2023-09-25 LAB — LIPID PANEL
Chol/HDL Ratio: 3.3 {ratio} (ref 0.0–4.4)
Cholesterol, Total: 192 mg/dL (ref 100–199)
HDL: 59 mg/dL (ref >39)
LDL Chol Calc (NIH): 115 mg/dL — ABNORMAL HIGH (ref 0–99)
Triglycerides: 99 mg/dL (ref 0–149)
VLDL Cholesterol Cal: 18 mg/dL (ref 5–40)

## 2023-09-25 LAB — THYROID PANEL WITH TSH
Free Thyroxine Index: 2.5 (ref 1.2–4.9)
T3 Uptake Ratio: 23 % — ABNORMAL LOW (ref 24–39)
T4, Total: 10.8 ug/dL (ref 4.5–12.0)
TSH: 1.76 u[IU]/mL (ref 0.450–4.500)

## 2023-09-28 ENCOUNTER — Telehealth: Payer: Self-pay

## 2023-09-28 MED ORDER — SKYRIZI 150 MG/ML ~~LOC~~ SOSY: PREFILLED_SYRINGE | SUBCUTANEOUS | 2 refills | Status: DC

## 2023-09-28 NOTE — Telephone Encounter (Signed)
-----   Message from Willeen Niece sent at 09/28/2023 12:44 PM EST ----- Labs okay. TB neg, send in 6 mos rfs Skyrizi - please call patient

## 2023-09-28 NOTE — Telephone Encounter (Signed)
Advised pt of lab results.  Refills of Skirizi sent to pharmacy.Marguerite Olea

## 2024-03-22 ENCOUNTER — Ambulatory Visit: Payer: 59 | Admitting: Dermatology

## 2024-03-22 DIAGNOSIS — L409 Psoriasis, unspecified: Secondary | ICD-10-CM | POA: Diagnosis not present

## 2024-03-22 DIAGNOSIS — Z79899 Other long term (current) drug therapy: Secondary | ICD-10-CM

## 2024-03-22 DIAGNOSIS — Z7189 Other specified counseling: Secondary | ICD-10-CM

## 2024-03-22 MED ORDER — VTAMA 1 % EX CREA: TOPICAL_CREAM | CUTANEOUS | 5 refills | Status: AC

## 2024-03-22 MED ORDER — SKYRIZI 150 MG/ML ~~LOC~~ SOSY: PREFILLED_SYRINGE | SUBCUTANEOUS | 2 refills | Status: DC

## 2024-03-22 MED ORDER — FLUOCINOLONE ACETONIDE BODY 0.01 % EX OIL
TOPICAL_OIL | CUTANEOUS | 5 refills | Status: AC
Start: 2024-03-22 — End: ?

## 2024-03-22 NOTE — Patient Instructions (Addendum)
 Your prescription was sent to [facility] in [place]. A representative from [facility] will contact you within 3 business hours to verify your address and insurance information to schedule a free delivery. If for any reason you do not receive a phone call from them, please reach out to them. Their phone number is [phone] and their hours are Monday-Friday 9:00 am-5:00 pm.     Due to recent changes in healthcare laws, you may see results of your pathology and/or laboratory studies on MyChart before the doctors have had a chance to review them. We understand that in some cases there may be results that are confusing or concerning to you. Please understand that not all results are received at the same time and often the doctors may need to interpret multiple results in order to provide you with the best plan of care or course of treatment. Therefore, we ask that you please give [place] 2 business days to thoroughly review all your results before contacting the office for clarification. Should we see a critical lab result, you will be contacted sooner.   If You Need Anything After Your Visit  If you have any questions or concerns for your doctor, please call our main line at [phone] and press option 4 to reach your doctor's medical assistant. If no one answers, please leave a voicemail as directed and we will return your call as soon as possible. Messages left after 4 pm will be answered the following business day.   You may also send [place] a message via MyChart. We typically respond to MyChart messages within 1-2 business days.  For prescription refills, please ask your pharmacy to contact our office. Our fax number is [phone].  If you have an urgent issue when the clinic is closed that cannot wait until the next business day, you can page your doctor at the number below.    Please note that while we do our best to be available for urgent issues outside of office hours, we are not  available 24/7.   If you have an urgent issue and are unable to reach [place], you may choose to seek medical care at your doctor's office, retail clinic, urgent care center, or emergency room.  If you have a medical emergency, please immediately call 911 or go to the emergency department.  Pager Numbers  - Dr. Gwen Pounds: [phone]  - Dr. Roseanne Reno: [phone]  - Dr. Katrinka Blazing: [phone]   In the event of inclement weather, please call our main line at [phone] for an update on the status of any delays or closures.  Dermatology Medication Tips: Please keep the boxes that topical medications come in in order to help keep track of the instructions about where and how to use these. Pharmacies typically print the medication instructions only on the boxes and not directly on the medication tubes.   If your medication is too expensive, please contact our office at [phone] option 4 or send [place] a message through MyChart.   We are unable to tell what your co-pay for medications will be in advance as this is different depending on your insurance coverage. However, we may be able to find a substitute medication at lower cost or fill out paperwork to get insurance to cover a needed medication.   If a prior authorization is required to get your medication covered by your insurance company, please allow [place] 1-2 business days to complete this process.  Drug prices often vary depending on where the prescription is  filled and some pharmacies may offer cheaper prices.  The website www.goodrx.com contains coupons for medications through different pharmacies. The prices here do not account for what the cost may be with help from insurance (it may be cheaper with your insurance), but the website can give you the price if you did not use any insurance.  - You can print the associated coupon and take it with your prescription to the pharmacy.  - You may also stop by our office during regular business hours  and pick up a GoodRx coupon card.  - If you need your prescription sent electronically to a different pharmacy, notify our office through [facility] or by phone at [phone] option 4.     Si Usted Necesita Algo Despus de Su Visita  Tambin puede enviarnos un mensaje a travs de Clinical cytogeneticist. Por lo general respondemos a los mensajes de MyChart en el transcurso de 1 a 2 das hbiles.  Para renovar recetas, por favor pida a su farmacia que se ponga en contacto con nuestra oficina. Annie Sable de fax es [place] [phone].  Si tiene un asunto urgente cuando la clnica est cerrada y que no puede esperar hasta el siguiente da hbil, puede llamar/localizar a su doctor(a) al nmero que aparece a continuacin.   Por favor, tenga en cuenta que aunque hacemos todo lo posible para estar disponibles para asuntos urgentes fuera del horario de [place], no estamos disponibles las 24 horas del da, los 7 [address]  [address] de la [place].   Si tiene un problema urgente y no puede comunicarse con nosotros, puede optar por buscar atencin mdica  en el consultorio de su doctor(a), en una clnica privada, en un centro de atencin urgente o en una sala de emergencias.  Si tiene Engineer, drilling, por favor llame inmediatamente al 911 o vaya a la sala de emergencias.  Nmeros de bper  - Dr. Gwen Pounds: [phone]  - Dra. Roseanne Reno: [phone]  - Dr. Katrinka Blazing: [phone]   En caso de inclemencias del tiempo, por favor llame a Lacy Duverney principal al [phone] para una actualizacin sobre el [place] de cualquier retraso o cierre.  Consejos para la medicacin en dermatologa: Por favor, guarde las cajas en las que vienen los medicamentos de uso tpico para ayudarle a seguir las instrucciones sobre dnde y cmo usarlos. Las farmacias generalmente imprimen las instrucciones del medicamento slo en las cajas y no directamente en los tubos del [place].   Si su medicamento es muy caro, por favor, pngase  en contacto con Rolm Gala llamando al [phone] y presione la opcin 4 o envenos un mensaje a travs de Clinical cytogeneticist.   No podemos decirle cul ser su copago por los medicamentos por adelantado ya que esto es diferente dependiendo de la cobertura de su seguro. Sin embargo, es posible que podamos encontrar un medicamento sustituto a Audiological scientist un formulario para que el seguro cubra el medicamento que se considera necesario.   Si se requiere una autorizacin previa para que su compaa de seguros [place] su medicamento, por favor permtanos de 1 a 2 das hbiles para completar [address]  Los precios de los medicamentos varan con frecuencia dependiendo del Environmental consultant de dnde se surte la receta y alguna farmacias pueden ofrecer precios ms baratos.  El sitio web www.goodrx.com tiene cupones para medicamentos de Health and safety inspector. Los precios aqu no tienen en cuenta lo que podra costar con la ayuda del seguro (puede ser ms barato con su seguro), pero el sitio web puede  darle el precio si no utiliz [employer].  - Puede imprimir el cupn correspondiente y llevarlo con su receta a la farmacia.  - Tambin puede pasar por nuestra oficina durante el horario de atencin regular y Education officer, museum una tarjeta de cupones de GoodRx.  - Si necesita que su receta se enve electrnicamente a una farmacia diferente, informe a nuestra oficina a travs de MyChart de [place] o por telfono llamando al [phone] y presione la opcin 4.

## 2024-03-22 NOTE — Progress Notes (Signed)
   Follow-Up Visit   Subjective  Andrea Goodman is a 48 y.o. female who presents for the following: Psoriasis scalp, post auricular, 40m f/u, Skyrizi  q 12 wks, Derma-Smooth FS oil prn, Luxiq foam scalp prn flares, Elidel cr prn flares. She has some joint pain in wrists. She starts to flare/itch a little 2 months after injection. Itching is all over body and in scalp.  Overall she is much improved from before Skyrizi  and she doesn't want to change it.    The following portions of the chart were reviewed this encounter and updated as appropriate: medications, allergies, medical history  Review of Systems:  No other skin or systemic complaints except as noted in HPI or Assessment and Plan.  Objective  Well appearing patient in no apparent distress; mood and affect are within normal limits.  Areas Examined: Face, scalp  Relevant exam findings are noted in the Assessment and Plan.      Assessment & Plan     PSORIASIS on systemic treatment Scalp, body clear today <1% BSA.  Chronic and persistent condition with duration or expected duration over one year. Condition is symptomatic/ bothersome to patient. Not currently at goal, but improved on Skyrizi .  Counseling and coordination of care for severe psoriasis on systemic treatment  Psoriasis - severe on systemic treatment.  Psoriasis is a chronic non-curable, but treatable genetic/hereditary disease that may have other systemic features affecting other organ systems such as joints (Psoriatic Arthritis).  It is linked with heart disease, inflammatory bowel disease, non-alcoholic fatty liver disease, and depression. Significant skin psoriasis and/or psoriatic arthritis may have significant symptoms and affects activities of daily activity and often benefits from systemic treatments.  These systemic treatments have some potential side effects including immunosuppression and require pre-treatment laboratory screening and periodic laboratory  monitoring and periodic in person evaluation and monitoring by the attending dermatologist physician (long term medication management).   Patient has some joint pain in wrists.  Treatment Plan: Labs 09/22/2023, wnl, TB neg.  Discussed switching biologic since itching/mild skin flare before next injection due. Patient prefers to continue Skyrizi  for now.   Cont Skyrizi  sq injections q 12 wks pending labs Start Vtama  cr qd aa prn flares (can use in place of Elidel) Cont Derma-Smooth FS oil qd scalp, ears prn flares Cont Luxiq foam qd aa scalp prn flares May cont Elidel qd aa face, body prn flares May try Zyrtec or Allegra once daily to help with itching before next injection.   Reviewed risks of biologics including immunosuppression, infections, injection site reaction, and failure to improve condition. Goal is control of skin condition, not cure.  Some older biologics such as Humira and Enbrel may slightly increase risk of malignancy and may worsen congestive heart failure.  Taltz and Cosentyx may cause inflammatory bowel disease to flare. The use of biologics requires long term medication management, including periodic office visits and monitoring of blood work.   Long term medication management.  Patient is using long term (months to years) prescription medication  to control their dermatologic condition.  These medications require periodic monitoring to evaluate for efficacy and side effects and may require periodic laboratory monitoring.   Return in about 6 months (around 09/21/2024) for Psoriasis.  IBernardine Bridegroom, CMA, am acting as scribe for Artemio Larry, MD .   Documentation: I have reviewed the above documentation for accuracy and completeness, and I agree with the above.  Artemio Larry, MD

## 2024-05-10 ENCOUNTER — Ambulatory Visit (INDEPENDENT_AMBULATORY_CARE_PROVIDER_SITE_OTHER): Payer: Self-pay | Admitting: Family Medicine

## 2024-05-10 ENCOUNTER — Encounter: Payer: Self-pay | Admitting: Family Medicine

## 2024-05-10 VITALS — BP 131/102 | HR 89 | Resp 16 | Ht 62.0 in | Wt 117.5 lb

## 2024-05-10 DIAGNOSIS — R030 Elevated blood-pressure reading, without diagnosis of hypertension: Secondary | ICD-10-CM | POA: Diagnosis not present

## 2024-05-10 DIAGNOSIS — F418 Other specified anxiety disorders: Secondary | ICD-10-CM

## 2024-05-10 DIAGNOSIS — Z800 Family history of malignant neoplasm of digestive organs: Secondary | ICD-10-CM | POA: Diagnosis not present

## 2024-05-10 DIAGNOSIS — M25531 Pain in right wrist: Secondary | ICD-10-CM | POA: Diagnosis not present

## 2024-05-10 DIAGNOSIS — Z23 Encounter for immunization: Secondary | ICD-10-CM | POA: Diagnosis not present

## 2024-05-10 DIAGNOSIS — Z1321 Encounter for screening for nutritional disorder: Secondary | ICD-10-CM

## 2024-05-10 DIAGNOSIS — Z1159 Encounter for screening for other viral diseases: Secondary | ICD-10-CM

## 2024-05-10 DIAGNOSIS — Z6821 Body mass index (BMI) 21.0-21.9, adult: Secondary | ICD-10-CM

## 2024-05-10 DIAGNOSIS — Z780 Asymptomatic menopausal state: Secondary | ICD-10-CM

## 2024-05-10 DIAGNOSIS — M25532 Pain in left wrist: Secondary | ICD-10-CM

## 2024-05-10 NOTE — Progress Notes (Signed)
 Established Patient Office Visit  Introduced to nurse practitioner role and practice setting.  All questions answered.  Discussed provider/patient relationship and expectations.  Subjective   Patient ID: Andrea Goodman, female    DOB: [birth year]  Age: 48 y.o. MRN: [id number]  Chief Complaint  Patient presents with   New Patient (Initial Visit)    New patient establishing care Previous PCP: None, only GYN Last pap smear: 12/23/2022 Negative, HPV-Negative Mammogram: 02/2023-Normal Colonoscopy: 08/04/24 Dr.Toledo, repeat in 5 years   Discussed the use of AI scribe software for clinical note transcription with the patient, who gave verbal consent to proceed.  History of Present Illness Andrea Goodman is a 48 year old female who presents for a comprehensive health evaluation due to family history of pancreatic cancer. She is a new patient.  Family history of pancreatic cancer - Mother diagnosed with pancreatic cancer in May, with extensive metastasis - Seeking comprehensive health evaluation due to family history  Migraine headaches - Under care of a neurologist for migraines  Psoriasis and suspected psoriatic arthritis - Psoriasis managed with Skyrizi  and Vtama  cream - Flare-ups more common in winter - Sees derm  Situational anxiety - increased with mom's diagnosis - OB/GYN prescribes prn klonoprin  Weight management - Was using compounded semaglutide, was 160lbs, now 117.  Hormone replacement therapy and sleep disturbance - On hormone replacement therapy: DOT patch, nightly progesterone, and a form of Loestrin - OB/Gyn prescribes - Uses Tylenol  PM at night for sleep  Physical activity - Very active, participates in cycling and weightlifting regularly  Preventive health maintenance - Up to date with colonoscopy and PAP smears - Due for mammogram - Interested in comprehensive laboratory workup for health monitoring      05/10/2024    9:41 AM 09/03/2015     9:40 AM  Depression screen PHQ 2/9  Decreased Interest 0 0  Down, Depressed, Hopeless 2   PHQ - 2 Score 2 0  Altered sleeping 0   Tired, decreased energy 0   Change in appetite 0   Feeling bad or failure about yourself  0   Trouble concentrating 2   Moving slowly or fidgety/restless 0   Suicidal thoughts 0   PHQ-9 Score 4   Difficult doing work/chores Very difficult        05/10/2024    9:42 AM  GAD 7 : Generalized Anxiety Score  Nervous, Anxious, on Edge 2  Control/stop worrying 2  Worry too much - different things 2  Trouble relaxing 2  Restless 2  Easily annoyed or irritable 2  Afraid - awful might happen 2  Total GAD 7 Score 14  Anxiety Difficulty Very difficult     Review of Systems  All other systems reviewed and are negative.   Negative unless indicated in HPI   Objective:     BP (!) 131/102 (BP Location: Left Arm, Patient Position: Sitting, Cuff Size: Normal)   Pulse 89   Resp 16   Ht 5' 2 (1.575 m)   Wt 117 lb 8 oz (53.3 kg)   SpO2 100%   BMI 21.49 kg/m    Physical Exam Constitutional:      General: She is not in acute distress.    Appearance: Normal appearance. She is normal weight. She is not ill-appearing, toxic-appearing or diaphoretic.  HENT:     Head: Normocephalic.     Right Ear: Tympanic membrane normal.     Left Ear: Tympanic membrane normal.     Nose: Nose  normal.     Mouth/Throat:     Mouth: Mucous membranes are moist.     Pharynx: Oropharynx is clear. No oropharyngeal exudate or posterior oropharyngeal erythema.  Eyes:     General: Lids are normal.     Extraocular Movements: Extraocular movements intact.     Right eye: Normal extraocular motion.     Left eye: Normal extraocular motion.     Conjunctiva/sclera: Conjunctivae normal.     Right eye: Right conjunctiva is not injected.     Left eye: Left conjunctiva is not injected.     Pupils: Pupils are equal, round, and reactive to light.  Neck:     Thyroid : No thyroid  mass,  thyromegaly or thyroid  tenderness.  Cardiovascular:     Rate and Rhythm: Normal rate and regular rhythm.     Pulses: Normal pulses.          Radial pulses are 2+ on the right side and 2+ on the left side.       Dorsalis pedis pulses are 2+ on the right side and 2+ on the left side.       Posterior tibial pulses are 2+ on the right side and 2+ on the left side.     Heart sounds: Normal heart sounds, S1 normal and S2 normal. No murmur heard.    No friction rub. No gallop.  Pulmonary:     Effort: Pulmonary effort is normal. No respiratory distress.     Breath sounds: Normal breath sounds. No stridor. No wheezing, rhonchi or rales.  Abdominal:     General: Bowel sounds are normal. There is no distension.     Palpations: Abdomen is soft. There is no mass.     Tenderness: There is no abdominal tenderness. There is no guarding or rebound.     Hernia: No hernia is present.  Musculoskeletal:        General: No swelling or tenderness. Normal range of motion.     Cervical back: Normal range of motion. No rigidity.  Lymphadenopathy:     Cervical: No cervical adenopathy.     Right cervical: No superficial, deep or posterior cervical adenopathy.    Left cervical: No superficial, deep or posterior cervical adenopathy.  Skin:    General: Skin is warm and dry.     Capillary Refill: Capillary refill takes less than 2 seconds.     Findings: No bruising or erythema.  Neurological:     General: No focal deficit present.     Mental Status: She is alert and oriented to person, place, and time. Mental status is at baseline.     GCS: GCS eye subscore is 4. GCS verbal subscore is 5. GCS motor subscore is 6.     Cranial Nerves: Cranial nerves 2-12 are intact.  Psychiatric:        Attention and Perception: Attention and perception normal.        Mood and Affect: Mood is anxious. Affect is tearful.        Speech: Speech normal.        Behavior: Behavior normal. Behavior is cooperative.        Thought  Content: Thought content normal.        Cognition and Memory: Cognition and memory normal.        Judgment: Judgment normal.      No results found for any visits on 05/10/24.    The 10-year ASCVD risk score (Arnett DK, et al., 2019) is: 0.9%  Assessment & Plan:  Family history of pancreatic cancer -     Hemoglobin A1c  Elevated blood-pressure reading without diagnosis of hypertension -     Lipid panel -     Comprehensive metabolic panel with GFR  Screening for viral disease -     HIV Antibody (routine testing w rflx) -     Hepatitis C antibody  Pain of both wrist joints -     C-reactive protein -     Sedimentation rate  BMI 21.0-21.9, adult -     CBC with Differential/Platelet -     Iron, TIBC and Ferritin Panel  GAD (generalized anxiety disorder) -     TSH -     CBC with Differential/Platelet  Encounter for vitamin deficiency screening -     Vitamin B12 -     VITAMIN D  25 Hydroxy (Vit-D Deficiency, Fractures) -     Vitamin B1 -     Folate  Immunization due -     Tdap vaccine greater than or equal to 7yo IM     Assessment and Plan Assessment & Plan Situational anxiety Anxiety likely due to stress from mother's pancreatic cancer diagnosis. Prefers Klonopin over SSRIs for temporary relief. - Continue Klonopin 0.5 mg as needed BID - prescribed by ob/gyn - Monitor anxiety symptoms and Klonopin usage. - Consider SSRI if anxiety worsens or Klonopin usage increases - recommend benzo is not for long term use - Discussed therapy options - pt has connections through work  Psoriasis Psoriasis managed with Skyrizi  and Vtama  cream. - Continue Skyrizi  and Vtama  cream as needed. - Continue to be managed by Derm - Concerns for arthritis pain in bilateral hands and wrist - will check CRP, ESR  Elevated BP w/o diagnosis of HTN - GOAL<120/80 - monitor at home weekly with upper arm cuff, L side - F./u sooner if BP is higher than goal - appears to be situationally  based given mothers diagnosis and new pt visit.  - Will check CMP. Lipid  Menopause - on HRT - prescribed by OB/GYN - Continue estradiol patch - contonie progesterone 200mg  - Continue Lo Loestrin  Family Hx of Pancreatic Cancer - mother diagnosed in mary - was healthy, A1C's became elevated - now stage 4 - very concerned for her own health - Will check CMP, A1C, CBC  BMI=21.49 - advised against compounded semaglutide - will check TSH, CBC, iron studies, screen from vitamin deficiency (D, b12, folate, thiamine)   Due for Tdap  - adminstered today  Screening for Viral  - HIV and Hep C   Return in about 6 months (around 11/10/2024) for annual physical.   I, Curtis DELENA Boom, FNP, have reviewed all documentation for this visit. The documentation on 05/10/24 for the exam, diagnosis, procedures, and orders are all accurate and complete.   Curtis DELENA Boom, FNP

## 2024-05-16 ENCOUNTER — Ambulatory Visit: Payer: Self-pay | Admitting: Family Medicine

## 2024-05-17 LAB — CBC WITH DIFFERENTIAL/PLATELET
Basophils Absolute: 0 x10E3/uL (ref 0.0–0.2)
Basos: 1 %
EOS (ABSOLUTE): 0.1 x10E3/uL (ref 0.0–0.4)
Eos: 2 %
Hematocrit: 41.1 % (ref 34.0–46.6)
Hemoglobin: 13.8 g/dL (ref 11.1–15.9)
Immature Grans (Abs): 0 x10E3/uL (ref 0.0–0.1)
Immature Granulocytes: 0 %
Lymphocytes Absolute: 1.4 x10E3/uL (ref 0.7–3.1)
Lymphs: 29 %
MCH: 31.9 pg (ref 26.6–33.0)
MCHC: 33.6 g/dL (ref 31.5–35.7)
MCV: 95 fL (ref 79–97)
Monocytes Absolute: 0.3 x10E3/uL (ref 0.1–0.9)
Monocytes: 6 %
Neutrophils Absolute: 3 x10E3/uL (ref 1.4–7.0)
Neutrophils: 62 %
Platelets: 293 x10E3/uL (ref 150–450)
RBC: 4.33 x10E6/uL (ref 3.77–5.28)
RDW: 12.6 % (ref 11.7–15.4)
WBC: 4.8 x10E3/uL (ref 3.4–10.8)

## 2024-05-17 LAB — LIPID PANEL
Chol/HDL Ratio: 3 ratio (ref 0.0–4.4)
Cholesterol, Total: 196 mg/dL (ref 100–199)
HDL: 66 mg/dL (ref >39)
LDL Chol Calc (NIH): 116 mg/dL — ABNORMAL HIGH (ref 0–99)
Triglycerides: 77 mg/dL (ref 0–149)
VLDL Cholesterol Cal: 14 mg/dL (ref 5–40)

## 2024-05-17 LAB — COMPREHENSIVE METABOLIC PANEL WITH GFR
ALT: 14 IU/L (ref 0–32)
AST: 15 IU/L (ref 0–40)
Albumin: 4.1 g/dL (ref 3.9–4.9)
Alkaline Phosphatase: 67 IU/L (ref 44–121)
BUN/Creatinine Ratio: 22 (ref 9–23)
BUN: 20 mg/dL (ref 6–24)
Bilirubin Total: 0.4 mg/dL (ref 0.0–1.2)
CO2: 18 mmol/L — ABNORMAL LOW (ref 20–29)
Calcium: 9 mg/dL (ref 8.7–10.2)
Chloride: 104 mmol/L (ref 96–106)
Creatinine, Ser: 0.91 mg/dL (ref 0.57–1.00)
Globulin, Total: 2.5 g/dL (ref 1.5–4.5)
Glucose: 76 mg/dL (ref 70–99)
Potassium: 4.2 mmol/L (ref 3.5–5.2)
Sodium: 137 mmol/L (ref 134–144)
Total Protein: 6.6 g/dL (ref 6.0–8.5)
eGFR: 78 mL/min/1.73 (ref >59)

## 2024-05-17 LAB — TSH: TSH: 1.24 u[IU]/mL (ref 0.450–4.500)

## 2024-05-17 LAB — VITAMIN B12: Vitamin B-12: 409 pg/mL (ref 232–1245)

## 2024-05-17 LAB — VITAMIN D 25 HYDROXY (VIT D DEFICIENCY, FRACTURES): Vit D, 25-Hydroxy: 39.1 ng/mL (ref 30.0–100.0)

## 2024-05-17 LAB — IRON,TIBC AND FERRITIN PANEL
Ferritin: 114 ng/mL (ref 15–150)
Iron Saturation: 45 % (ref 15–55)
Iron: 188 ug/dL — ABNORMAL HIGH (ref 27–159)
Total Iron Binding Capacity: 418 ug/dL (ref 250–450)
UIBC: 230 ug/dL (ref 131–425)

## 2024-05-17 LAB — FOLATE: Folate: 4.2 ng/mL (ref >3.0)

## 2024-05-17 LAB — HEPATITIS C ANTIBODY: Hep C Virus Ab: NONREACTIVE

## 2024-05-17 LAB — VITAMIN B1: Thiamine: 108 nmol/L (ref 66.5–200.0)

## 2024-05-17 LAB — HEMOGLOBIN A1C
Est. average glucose Bld gHb Est-mCnc: 97 mg/dL
Hgb A1c MFr Bld: 5 % (ref 4.8–5.6)

## 2024-05-17 LAB — C-REACTIVE PROTEIN: CRP: 5 mg/L (ref 0–10)

## 2024-05-17 LAB — SEDIMENTATION RATE: Sed Rate: 6 mm/h (ref 0–32)

## 2024-05-17 LAB — HIV ANTIBODY (ROUTINE TESTING W REFLEX): HIV Screen 4th Generation wRfx: NONREACTIVE

## 2024-09-06 ENCOUNTER — Ambulatory Visit
Admission: RE | Admit: 2024-09-06 | Discharge: 2024-09-06 | Disposition: A | Source: Ambulatory Visit | Attending: Obstetrics and Gynecology | Admitting: Obstetrics and Gynecology

## 2024-09-06 ENCOUNTER — Other Ambulatory Visit: Payer: Self-pay | Admitting: Obstetrics and Gynecology

## 2024-09-06 DIAGNOSIS — Z1231 Encounter for screening mammogram for malignant neoplasm of breast: Secondary | ICD-10-CM | POA: Diagnosis present

## 2024-09-19 ENCOUNTER — Encounter: Payer: Self-pay | Admitting: Dermatology

## 2024-09-19 ENCOUNTER — Ambulatory Visit: Admitting: Dermatology

## 2024-09-19 DIAGNOSIS — L820 Inflamed seborrheic keratosis: Secondary | ICD-10-CM

## 2024-09-19 DIAGNOSIS — Z79899 Other long term (current) drug therapy: Secondary | ICD-10-CM

## 2024-09-19 DIAGNOSIS — L409 Psoriasis, unspecified: Secondary | ICD-10-CM

## 2024-09-19 DIAGNOSIS — Z7189 Other specified counseling: Secondary | ICD-10-CM

## 2024-09-19 NOTE — Patient Instructions (Addendum)

## 2024-09-19 NOTE — Progress Notes (Signed)
 Follow-Up Visit   Subjective  Andrea Goodman is a 48 y.o. female who presents for the following: Psoriasis 76m f/u, scalp, body, Skyrizi  sq injections q 12 wks, Vtama  prn, Derma Smooth FS oil scalp/ears prn, Luxiq foam prn, Elidel prn, recent psoriasis flare, patients insurance changed and shot was 2 months overdue and flared, pt has since had injection and is improving, itchy spot on back   The following portions of the chart were reviewed this encounter and updated as appropriate: medications, allergies, medical history  Review of Systems:  No other skin or systemic complaints except as noted in HPI or Assessment and Plan.  Objective  Well appearing patient in no apparent distress; mood and affect are within normal limits.   A focused examination was performed of the following areas: Scalp, ears, hands  Relevant exam findings are noted in the Assessment and Plan.  Left Upper Back x 1 Stuck on waxy papule with erythema  Assessment & Plan   PSORIASIS on Systemic Treatment Scalp, ears, body Exam: mild erythema post scalp, post auricular neck, occipital hairline, pink patches back 4% BSA.  Chronic and persistent condition with duration or expected duration over one year. Condition is improving with treatment but not currently at goal.   patient has joint pain but improved on Skyrizi .  Labs QuantiFERON-TB GOLD Plus due  Psoriasis - severe on systemic treatment.  Psoriasis is a chronic non-curable, but treatable genetic/hereditary disease that may have other systemic features affecting other organ systems such as joints (Psoriatic Arthritis).  It is linked with heart disease, inflammatory bowel disease, non-alcoholic fatty liver disease, and depression. Significant skin psoriasis and/or psoriatic arthritis may have significant symptoms and affects activities of daily activity and often benefits from systemic treatments.  These systemic treatments have some potential side  effects including immunosuppression and require pre-treatment laboratory screening and periodic laboratory monitoring and periodic in person evaluation and monitoring by the attending dermatologist physician (long term medication management).    Treatment Plan: Cont Skyrizi  sq injections q 12 wks, refills will be sent pending lab results, pt will let us  know details of new speciality pharmacy [facility] Start Vtama  cr qd aa prn flares (pt will call for refills) Cont Derma-Smooth FS oil qd scalp, ears prn flares, (pt will call for refills) Cont Luxiq foam qd aa scalp prn flares (pt will call for refills) May cont Elidel qd aa face, body prn flares (pt will call for refills) May try Zyrtec or Allegra once daily to help with itching before next injection.   Reviewed risks of biologics including immunosuppression, infections (i.e. TB reactivation), injection site reaction, and failure to improve condition. Goal is control of skin condition, not cure.  Some older biologics such as Humira and Enbrel may slightly increase risk of malignancy and may worsen congestive heart failure.  Taltz, Cosentyx, and Bimzelx may cause inflammatory bowel disease to flare or may increase incidence of yeast infections. Skyrizi , Tremfya, and Stelara may also slightly increase risk of infection. The use of biologics requires long term medication management, including periodic office visits, annual TB screening test and monitoring of blood work.  Monitoring for Tuberculosis Infection:  Patient is currently receiving biologic therapy for psoriasis, which carries a potential risk for reactivation of tuberculosis infection. As part of ongoing safety monitoring, a QuantiFERON-TB Gold (QFT-G) test is checked prior to initiation of biologic therapy and yearly per guidelines. - Most recent QFT-G result:     Latest Ref Rng & Units 09/22/2023  9:24 AM  Quantiferon TB Gold  Quantiferon TB Gold Plus Negative Negative     -  Will recheck yearly  - No signs or symptoms of active TB noted. - Will continue to monitor per standard biologic safety protocol. - If QFT-G becomes positive or patient develops symptoms suggestive of TB, appropriate evaluation will be initiated  Long term medication management.  Patient is using long term (months to years) prescription medication  to control their dermatologic condition.  These medications require periodic monitoring to evaluate for efficacy and side effects and may require periodic laboratory monitoring.    Topical steroids (such as triamcinolone, fluocinolone , fluocinonide, mometasone, clobetasol, halobetasol, betamethasone, hydrocortisone) can cause thinning and lightening of the skin if they are used for too long in the same area. Your physician has selected the right strength medicine for your problem and area affected on the body. Please use your medication only as directed by your physician to prevent side effects.   INFLAMED SEBORRHEIC KERATOSIS Left Upper Back x 1 Symptomatic, irritating, patient would like treated. - Destruction of lesion - Left Upper Back x 1  Destruction method: cryotherapy   Informed consent: discussed and consent obtained   Lesion destroyed using liquid nitrogen: Yes   Region frozen until ice ball extended beyond lesion: Yes   Outcome: patient tolerated procedure well with no complications   Post-procedure details: wound care instructions given   Additional details:  Prior to procedure, discussed risks of blister formation, small wound, skin dyspigmentation, or rare scar following cryotherapy. Recommend Vaseline ointment to treated areas while healing.   PSORIASIS   This Visit - QuantiFERON-TB Gold Plus  Return in about 6 months (around 03/20/2025) for Psoriasis f/u.  I, Grayce Saunas, RMA, am acting as scribe for Rexene Rattler, MD .   Documentation: I have reviewed the above documentation for accuracy and completeness, and I agree with  the above.  Rexene Rattler, MD

## 2024-09-22 LAB — QUANTIFERON-TB GOLD PLUS
QuantiFERON Mitogen Value: >10 [IU]/mL
QuantiFERON Nil Value: 0.05 [IU]/mL
QuantiFERON TB1 Ag Value: 0.04 [IU]/mL
QuantiFERON TB2 Ag Value: 0.04 [IU]/mL
QuantiFERON-TB Gold Plus: NEGATIVE

## 2024-09-27 ENCOUNTER — Ambulatory Visit: Payer: Self-pay | Admitting: Dermatology

## 2024-09-27 MED ORDER — SKYRIZI 150 MG/ML ~~LOC~~ SOSY: PREFILLED_SYRINGE | SUBCUTANEOUS | 2 refills | Status: AC

## 2024-09-27 NOTE — Telephone Encounter (Signed)
-----   Message from Rexene Rattler, MD sent at 09/27/2024 11:12 AM EST ----- TB negative, okay to send in 6 mos rfs Skyrizi   - please call patient

## 2024-09-27 NOTE — Telephone Encounter (Signed)
 Patient advised of lab results and Skyrizi  RF sent in. aw

## 2024-10-03 ENCOUNTER — Telehealth: Payer: Self-pay

## 2024-10-03 NOTE — Telephone Encounter (Signed)
 I have submitted patient's information to [employer] to see if we can get the patient eligible for bridge. aw

## 2024-10-03 NOTE — Telephone Encounter (Signed)
 Insurance will not cover Skyrizi  and states that it is a plan exclusion so appeal is also not available and they recommend alternatives such as Yesintek or Hadima. Please advise.

## 2024-10-17 ENCOUNTER — Encounter: Payer: Self-pay | Admitting: Dermatology

## 2024-11-11 ENCOUNTER — Encounter: Payer: Self-pay | Admitting: Family Medicine

## 2024-11-11 ENCOUNTER — Ambulatory Visit: Admitting: Family Medicine

## 2024-11-11 ENCOUNTER — Encounter: Admitting: Family Medicine

## 2024-11-11 VITALS — BP 126/74 | HR 73 | Ht 62.0 in | Wt 118.0 lb

## 2024-11-11 DIAGNOSIS — E7800 Pure hypercholesterolemia, unspecified: Secondary | ICD-10-CM

## 2024-11-11 DIAGNOSIS — L409 Psoriasis, unspecified: Secondary | ICD-10-CM | POA: Insufficient documentation

## 2024-11-11 DIAGNOSIS — E538 Deficiency of other specified B group vitamins: Secondary | ICD-10-CM | POA: Insufficient documentation

## 2024-11-11 DIAGNOSIS — I7300 Raynaud's syndrome without gangrene: Secondary | ICD-10-CM | POA: Insufficient documentation

## 2024-11-11 DIAGNOSIS — G43109 Migraine with aura, not intractable, without status migrainosus: Secondary | ICD-10-CM | POA: Insufficient documentation

## 2024-11-11 DIAGNOSIS — R1012 Left upper quadrant pain: Secondary | ICD-10-CM

## 2024-11-11 DIAGNOSIS — R200 Anesthesia of skin: Secondary | ICD-10-CM | POA: Insufficient documentation

## 2024-11-11 DIAGNOSIS — N951 Menopausal and female climacteric states: Secondary | ICD-10-CM | POA: Insufficient documentation

## 2024-11-11 MED ORDER — AMLODIPINE BESYLATE 5 MG PO TABS: 5.0000 mg | ORAL_TABLET | Freq: Every day | ORAL | 1 refills | Status: AC

## 2024-11-11 NOTE — Patient Instructions (Signed)
 To keep you healthy, please keep in mind the following health maintenance items that you are due for:   Health Maintenance Due  Topic Date Due   Hepatitis B Vaccines 19-59 Average Risk (1 of 3 - 19+ 3-dose series) Never done   Influenza Vaccine  05/06/2024   COVID-19 Vaccine (4 - 2025-26 season) 06/06/2024     Best Wishes,   Dr. Lang

## 2024-11-11 NOTE — Progress Notes (Unsigned)
 "  Transfer of Care  Established Patient Office Visit  Patient ID: Andrea Goodman, Female   DOB: [birth year] 49 y.o. MRN: [id number]  Chief Complaint  Patient presents with   Establish Care    Patient is present to establish care with new PCP  Diet is normal per patient, exercises by cycling 3-4 week, weightlifting 2x week, walking on weekends   Patient has some concerns mother was diagnosed with pancreatic cancer in may of 24 and passed in Sept. Is caught up on all [facility] recommended screenings but may be interested in genetic or other screenings to ensure she is not at risk for this    Numbness    Patient has been experiencing numbness in hands, feet and sometimes in her nose. Has been experiencing this for many years, never had PCP    Subjective:     Andrea Goodman presents to establish care  HPI  Discussed the use of AI scribe software for clinical note transcription with the patient, who gave verbal consent to proceed.  History of Present Illness Andrea Goodman is a 49 year old female who presents to transfer care and discuss genetic screening for pancreatic cancer risk.  She is concerned about her risk for pancreatic cancer following her mother's recent diagnosis and passing from the disease. She is up to date on her health maintenance screenings.  She has a history of hypertension, with a recent blood pressure reading of 140/121, which she attributes to stress related to her mother's illness. Her blood pressure has since improved to 126/74.  She experiences numbness in her hands, feet, and nose, which she believes is Raynaud's phenomenon. This has been ongoing for 20 years, with nasal involvement starting last year. Symptoms are triggered by cold and stress, causing significant discomfort.  She has a history of psoriatic arthritis, managed with Skyrizi  150 mg every three months, which also controls her psoriasis. A delay in medication led to severe joint pain,  particularly in her hands, which resolved after resuming treatment.  She is on clonazepam 0.5 mg twice daily for anxiety, estradiol 0.1 mg patches, Prometrium 200 mg daily for menopausal symptoms, and flucinolone  acetyl body oil. She also takes Tylenol  PM nightly for sleep.  She reports menopausal symptoms including extreme irritability, brain fog, and night sweats.  She has a history of migraines since childhood, with cycles of continuous migraines lasting months to years. She is not currently in a migraine cycle and uses medication to abort migraines when they occur.  She exercises regularly, cycling three to four times a week and weightlifting twice a week. 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  No rectal bleeding, epigastric pain, GERD, chest pain, heart fluttering, or dizziness. Reports dry mouth, occasional constipation, and pain between ribs radiating to the back.   Outpatient Encounter Medications as of 11/11/2024  Medication Sig   amLODipine  (NORVASC ) 5 MG tablet Take 1 tablet (5 mg total) by mouth daily.   clonazePAM (KLONOPIN) 0.5 MG tablet Take 0.5 mg by mouth 2 (two) times daily as needed for anxiety.   estradiol (VIVELLE-DOT) 0.1 MG/24HR patch Place 1 patch onto the skin.   Fluocinolone  Acetonide Body 0.01 % OIL Apply to affected areas scalp, ears once to twice daily prn flares.   progesterone (PROMETRIUM) 200 MG capsule Take 200 mg by mouth.   risankizumab -rzaa (SKYRIZI ) 150 MG/ML SOSY prefilled syringe INJECT 150MG  SUBCUTANEOUSLY  EVERY 12 WEEKS   Tapinarof  (VTAMA ) 1 %  CREA Apply Vtama  once a day to affected areas of psoriasis.   [DISCONTINUED] LO LOESTRIN FE 1 MG-10 MCG / 10 MCG tablet    No facility-administered encounter medications on file as of 11/11/2024.    Past Medical History:  Diagnosis Date   Anxiety    Leg pain, right 09/27/2012   This has resolved with rest but with recurrent problems suspect she  gets too high impact with running     Migraines    Osteopenia    Psoriasis    Stress fracture of tibia 09/27/2012   My review of MRI would suggest she had a grade 1 stress FX on RT and stress RX on LT      Past Surgical History:  Procedure Laterality Date   CESAREAN SECTION      Family History  Problem Relation Age of Onset   Pancreatic cancer Mother    Heart attack Father    Stroke Maternal Grandmother    Clotting disorder Maternal Grandfather    Diabetes Paternal Grandfather    Dementia Paternal Grandfather    Crohn's disease Other    Breast cancer Neg Hx     Social History   Socioeconomic History   Marital status: Married    Spouse name: Not on file   Number of children: Not on file   Years of education: Not on file   Highest education level: Not on file  Occupational History   Not on file  Tobacco Use   Smoking status: Never   Smokeless tobacco: Not on file  Vaping Use   Vaping status: Never Used  Substance and Sexual Activity   Alcohol use: Not Currently    Comment: socially-maybe 4 drinks /year   Drug use: Never   Sexual activity: Yes  Other Topics Concern   Not on file  Social History Narrative   Not on file   Social Drivers of Health   Tobacco Use: Unknown (11/11/2024)   Patient History    Smoking Tobacco Use: Never    Smokeless Tobacco Use: Unknown    Passive Exposure: Not on file  Financial Resource Strain: Low Risk  (12/30/2023)   Received from [facility]   Overall Financial Resource Strain (CARDIA)    Difficulty of Paying Living Expenses: Not hard at all  Food Insecurity: No Food Insecurity (12/30/2023)   Received from [facility]   Epic    Within the past 12 months, you worried that your food would run out before you got the money to buy more.: Never true    Within the past 12 months, the food you bought just didn't last and you didn't have money to get more.: Never true  Transportation Needs: No  Transportation Needs (12/30/2023)   Received from [facility] - Transportation    In the past 12 months, has lack of transportation kept you from medical appointments or from getting medications?: No    Lack of Transportation (Non-Medical): No  Physical Activity: Not on file  Stress: Not on file  Social Connections: Not on file  Intimate Partner Violence: Not on file  Depression (EYV7-0): Low Risk (11/11/2024)   Depression (PHQ2-9)    PHQ-2 Score: 3  Alcohol Screen: Not on file  Housing: Low Risk  (12/30/2023)   Received from [facility]   Epic    In the last 12 months, was there a time when you were not able to pay the mortgage or  rent on time?: No    In the past 12 months, how many times have you moved where you were living?: 0    At any time in the past 12 months, were you homeless or living in a shelter (including now)?: No  Utilities: Not At Risk (12/30/2023)   Received from [facility] Utilities    Threatened with loss of utilities: No  Health Literacy: Not on file    ROS    Objective:    BP 126/74 (Cuff Size: Normal)   Pulse 73   Ht 5' 2 (1.575 m)   Wt 118 lb (53.5 kg)   BMI 21.58 kg/m   Physical Exam Vitals reviewed.  Constitutional:      General: She is not in acute distress.    Appearance: Normal appearance. She is not ill-appearing.  Neck:     Thyroid : No thyroid  mass, thyromegaly or thyroid  tenderness.  Cardiovascular:     Rate and Rhythm: Normal rate and regular rhythm.  Pulmonary:     Effort: Pulmonary effort is normal. No respiratory distress.     Breath sounds: No wheezing, rhonchi or rales.  Skin:    Comments: Cyanotic finger tips bilaterally   Neurological:     Mental Status: She is alert and oriented to person, place, and time.  Psychiatric:        Mood and Affect: Mood normal.        Behavior: Behavior normal.     {PhysExam Abridge (Optional):[id number]}  Last CBC Lab  Results  Component Value Date   WBC 4.8 05/12/2024   HGB 13.8 05/12/2024   HCT 41.1 05/12/2024   MCV 95 05/12/2024   MCH 31.9 05/12/2024   RDW 12.6 05/12/2024   PLT 293 05/12/2024   Last metabolic panel Lab Results  Component Value Date   GLUCOSE 76 05/12/2024   NA 137 05/12/2024   K 4.2 05/12/2024   CL 104 05/12/2024   CO2 18 (L) 05/12/2024   BUN 20 05/12/2024   CREATININE 0.91 05/12/2024   EGFR 78 05/12/2024   CALCIUM 9.0 05/12/2024   PROT 6.6 05/12/2024   ALBUMIN 4.1 05/12/2024   LABGLOB 2.5 05/12/2024   AGRATIO 2.1 09/05/2022   BILITOT 0.4 05/12/2024   ALKPHOS 67 05/12/2024   AST 15 05/12/2024   ALT 14 05/12/2024   Last lipids Lab Results  Component Value Date   CHOL 196 05/12/2024   HDL 66 05/12/2024   LDLCALC 116 (H) 05/12/2024   TRIG 77 05/12/2024   CHOLHDL 3.0 05/12/2024   Last hemoglobin A1c Lab Results  Component Value Date   HGBA1C 5.0 05/12/2024   Last thyroid  functions Lab Results  Component Value Date   TSH 1.240 05/12/2024   T4TOTAL 10.8 09/22/2023   Last vitamin D  Lab Results  Component Value Date   VD25OH 39.1 05/12/2024   Last vitamin B12 and Folate Lab Results  Component Value Date   VITAMINB12 409 05/12/2024   FOLATE 4.2 05/12/2024        Assessment & Plan:   Problem List Items Addressed This Visit     Menopausal symptoms   Migraine with aura and without status migrainosus, not intractable   Relevant Medications   amLODipine  (NORVASC ) 5 MG tablet   Numbness and tingling in both hands   Relevant Orders   ANA+ENA+DNA/DS+Antich+Centr   Sedimentation rate   C-reactive protein   Celiac Disease Panel   Rheumatoid factor   Sjogren's syndrome antibods(ssa + ssb)  CBC w/Diff/Platelet   Retic   TSH + free T4   Vitamin B12   Folate   Psoriasis - Primary   Relevant Orders   CBC w/Diff/Platelet   Raynaud disease without gangrene   Relevant Medications   amLODipine  (NORVASC ) 5 MG tablet   Other Relevant Orders    CMP14+EGFR   Other Visit Diagnoses       Elevated LDL cholesterol level         Colicky LUQ abdominal pain       Relevant Orders   US  Abdomen Complete       Assessment and Plan Assessment & Plan Raynaud's syndrome Chronic Raynaud's syndrome with cyanotic discoloration and pain in fingers and toes, exacerbated by cold and stress. Symptoms have persisted for 20 years. Differential includes psoriatic arthritis due to associated joint pain. - Prescribed amlodipine  5 mg daily to manage symptoms. - Ordered complete ultrasound to assess structural abnormalities. - Ordered autoimmune panel to rule out other conditions. - Will consider referral to rheumatology if autoimmune panel indicates need.  Psoriasis with psoriatic arthritis Psoriasis managed with Skyrizi , effective for both psoriasis and psoriatic arthritis. Recent insurance change led to a lapse in medication, resulting in severe joint pain, which resolved upon resuming Skyrizi . Insurance no longer covers Skyrizi , and she is exploring options for coverage. - Continue Skyrizi  as long as insurance allows. - Explore insurance options to cover Skyrizi . - Monitor joint pain and adjust treatment as needed.  Menopausal symptoms Include extreme irritability, brain fog, inability to concentrate, and night sweats. Managed with estradiol patches and Prometrium. - Continue estradiol patches and Prometrium for symptom management.  Migraine with aura Migraines with aura, currently not in a migraine cycle. Last cycle was in 2021. Managed with abortive medications as needed. - Continue current abortive medications for migraine management.  Vitamin B12 deficiency Previously managed with weekly B12 injections. - Continue monitoring B12 levels.  Hypercholesterolemia LDL slightly elevated at 115 mg/dL, but total cholesterol within normal range. Family history of similar lipid profiles. - Continue monitoring lipid levels.  Colicky left upper  quadrant abdominal pain Intermittent colicky pain in the left upper quadrant, possibly related to trapped gas or constipation. No signs of acute abdomen or severe pathology. - Ordered ultrasound to assess for structural abnormalities. - Will consider CT if ultrasound is inconclusive.  General health maintenance Up to date on health maintenance screenings. Blood pressure initially elevated but normalized upon recheck. Interested in genetic screening for pancreatic cancer risk due to family history. - Ordered genetic screening for pancreatic cancer risk. - Scheduled follow-up appointment in four months to review results and adjust management as needed.    Return in about 3 months (around 02/08/2025).   Rockie Agent, MD [facility]     "

## 2025-02-15 ENCOUNTER — Ambulatory Visit: Admitting: Family Medicine

## 2025-03-21 ENCOUNTER — Ambulatory Visit: Admitting: Dermatology
